# Patient Record
Sex: Male | Born: 1955 | Race: White | Hispanic: No | Marital: Married | Smoking: Current every day smoker
Health system: Southern US, Community
[De-identification: ages and names within clinical notes are randomized; demographics above are authoritative.]

## PROBLEM LIST (undated history)

## (undated) DIAGNOSIS — I209 Angina pectoris, unspecified: Secondary | ICD-10-CM

## (undated) DIAGNOSIS — K5900 Constipation, unspecified: Secondary | ICD-10-CM

## (undated) DIAGNOSIS — K8020 Calculus of gallbladder without cholecystitis without obstruction: Secondary | ICD-10-CM

## (undated) DIAGNOSIS — I639 Cerebral infarction, unspecified: Secondary | ICD-10-CM

## (undated) DIAGNOSIS — N451 Epididymitis: Secondary | ICD-10-CM

## (undated) DIAGNOSIS — I499 Cardiac arrhythmia, unspecified: Secondary | ICD-10-CM

## (undated) DIAGNOSIS — I350 Nonrheumatic aortic (valve) stenosis: Secondary | ICD-10-CM

## (undated) DIAGNOSIS — I050 Rheumatic mitral stenosis: Secondary | ICD-10-CM

## (undated) DIAGNOSIS — R0602 Shortness of breath: Secondary | ICD-10-CM

## (undated) DIAGNOSIS — I739 Peripheral vascular disease, unspecified: Secondary | ICD-10-CM

## (undated) DIAGNOSIS — I998 Other disorder of circulatory system: Secondary | ICD-10-CM

## (undated) DIAGNOSIS — G510 Bell's palsy: Secondary | ICD-10-CM

## (undated) DIAGNOSIS — G40909 Epilepsy, unspecified, not intractable, without status epilepticus: Secondary | ICD-10-CM

## (undated) DIAGNOSIS — I10 Essential (primary) hypertension: Secondary | ICD-10-CM

## (undated) DIAGNOSIS — R197 Diarrhea, unspecified: Secondary | ICD-10-CM

## (undated) DIAGNOSIS — E7800 Pure hypercholesterolemia, unspecified: Secondary | ICD-10-CM

## (undated) DIAGNOSIS — I219 Acute myocardial infarction, unspecified: Secondary | ICD-10-CM

## (undated) DIAGNOSIS — E785 Hyperlipidemia, unspecified: Secondary | ICD-10-CM

## (undated) HISTORY — DX: Hyperlipidemia, unspecified: E78.5

## (undated) HISTORY — DX: Essential (primary) hypertension: I10

## (undated) HISTORY — DX: Other disorder of circulatory system: I99.8

## (undated) HISTORY — DX: Pure hypercholesterolemia, unspecified: E78.00

## (undated) HISTORY — DX: Cerebral infarction, unspecified: I63.9

## (undated) HISTORY — DX: Epilepsy, unspecified, not intractable, without status epilepticus: G40.909

## (undated) HISTORY — DX: Peripheral vascular disease, unspecified: I73.9

## (undated) HISTORY — DX: Nonrheumatic aortic (valve) stenosis: I35.0

## (undated) HISTORY — DX: Acute myocardial infarction, unspecified: I21.9

## (undated) HISTORY — PX: CORONARY ANGIOPLASTY: SHX604

## (undated) HISTORY — DX: Bell's palsy: G51.0

## (undated) HISTORY — DX: Rheumatic mitral stenosis: I05.0

## (undated) HISTORY — DX: Epididymitis: N45.1

---

## 1999-08-14 ENCOUNTER — Ambulatory Visit (HOSPITAL_COMMUNITY): Admission: RE | Admit: 1999-08-14 | Discharge: 1999-08-14 | Payer: Self-pay | Admitting: Orthopedic Surgery

## 1999-08-14 ENCOUNTER — Encounter: Payer: Self-pay | Admitting: Orthopedic Surgery

## 2003-06-06 ENCOUNTER — Ambulatory Visit (HOSPITAL_COMMUNITY): Admission: RE | Admit: 2003-06-06 | Discharge: 2003-06-06 | Payer: Self-pay | Admitting: *Deleted

## 2003-06-26 ENCOUNTER — Ambulatory Visit (HOSPITAL_COMMUNITY): Admission: RE | Admit: 2003-06-26 | Discharge: 2003-06-26 | Payer: Self-pay | Admitting: Cardiology

## 2003-07-18 ENCOUNTER — Encounter: Payer: Self-pay | Admitting: Family Medicine

## 2003-07-18 ENCOUNTER — Ambulatory Visit (HOSPITAL_COMMUNITY): Admission: RE | Admit: 2003-07-18 | Discharge: 2003-07-18 | Payer: Self-pay | Admitting: Family Medicine

## 2004-10-16 ENCOUNTER — Ambulatory Visit (HOSPITAL_COMMUNITY): Admission: RE | Admit: 2004-10-16 | Discharge: 2004-10-16 | Payer: Self-pay | Admitting: Family Medicine

## 2005-09-29 HISTORY — PX: OTHER SURGICAL HISTORY: SHX169

## 2006-02-02 ENCOUNTER — Ambulatory Visit (HOSPITAL_COMMUNITY): Admission: RE | Admit: 2006-02-02 | Discharge: 2006-02-02 | Payer: Self-pay | Admitting: Family Medicine

## 2006-04-03 ENCOUNTER — Ambulatory Visit (HOSPITAL_COMMUNITY): Admission: RE | Admit: 2006-04-03 | Discharge: 2006-04-03 | Payer: Self-pay | Admitting: Psychiatry

## 2006-07-07 ENCOUNTER — Ambulatory Visit (HOSPITAL_COMMUNITY): Admission: RE | Admit: 2006-07-07 | Discharge: 2006-07-07 | Payer: Self-pay | Admitting: Cardiovascular Disease

## 2006-07-16 ENCOUNTER — Ambulatory Visit (HOSPITAL_COMMUNITY): Admission: RE | Admit: 2006-07-16 | Discharge: 2006-07-17 | Payer: Self-pay | Admitting: Cardiology

## 2011-10-08 ENCOUNTER — Telehealth: Payer: Self-pay

## 2011-10-09 NOTE — Telephone Encounter (Signed)
Pt said he had a colonoscopy approx 10 yrs ago at [facility]. Said he had polyps but was not told when he should have his next. Has a family hx of colon cancer in his Dad. Has OV for 10/15/2011 @ 9:30 Am with Tana Coast, PA.

## 2011-10-15 ENCOUNTER — Ambulatory Visit: Payer: Self-pay | Admitting: Gastroenterology

## 2011-12-29 DIAGNOSIS — N451 Epididymitis: Secondary | ICD-10-CM

## 2011-12-29 HISTORY — DX: Epididymitis: N45.1

## 2012-01-14 ENCOUNTER — Ambulatory Visit (INDEPENDENT_AMBULATORY_CARE_PROVIDER_SITE_OTHER): Payer: Medicaid Other | Admitting: Gastroenterology

## 2012-01-14 ENCOUNTER — Ambulatory Visit (HOSPITAL_COMMUNITY)
Admission: RE | Admit: 2012-01-14 | Discharge: 2012-01-14 | Disposition: A | Discharge status: Home / Self Care | Payer: Medicaid Other | Source: Ambulatory Visit | Attending: Gastroenterology | Admitting: Gastroenterology

## 2012-01-14 ENCOUNTER — Encounter: Payer: Self-pay | Admitting: Gastroenterology

## 2012-01-14 ENCOUNTER — Encounter (HOSPITAL_COMMUNITY): Payer: Self-pay

## 2012-01-14 VITALS — BP 151/84 | HR 103 | Temp 97.6°F | Ht 71.0 in | Wt 236.6 lb

## 2012-01-14 DIAGNOSIS — R109 Unspecified abdominal pain: Secondary | ICD-10-CM

## 2012-01-14 DIAGNOSIS — R1031 Right lower quadrant pain: Secondary | ICD-10-CM | POA: Insufficient documentation

## 2012-01-14 DIAGNOSIS — N508 Other specified disorders of male genital organs: Secondary | ICD-10-CM | POA: Insufficient documentation

## 2012-01-14 DIAGNOSIS — Z800 Family history of malignant neoplasm of digestive organs: Secondary | ICD-10-CM

## 2012-01-14 DIAGNOSIS — K8020 Calculus of gallbladder without cholecystitis without obstruction: Secondary | ICD-10-CM | POA: Insufficient documentation

## 2012-01-14 LAB — CBC WITH DIFFERENTIAL/PLATELET
Basophils Absolute: 0.1 10*3/uL (ref 0.0–0.1)
Basophils Relative: 1 % (ref 0–1)
Eosinophils Absolute: 0.1 10*3/uL (ref 0.0–0.7)
Eosinophils Relative: 1 % (ref 0–5)
HCT: 50.6 % (ref 39.0–52.0)
Hemoglobin: 17.7 g/dL — ABNORMAL HIGH (ref 13.0–17.0)
MCH: 32.3 pg (ref 26.0–34.0)
MCHC: 35 g/dL (ref 30.0–36.0)
MCV: 92.3 fL (ref 78.0–100.0)
Monocytes Absolute: 0.9 10*3/uL (ref 0.1–1.0)
Monocytes Relative: 9 % (ref 3–12)
Neutro Abs: 6.2 10*3/uL (ref 1.7–7.7)
RDW: 12.9 % (ref 11.5–15.5)

## 2012-01-14 LAB — COMPREHENSIVE METABOLIC PANEL
AST: 15 U/L (ref 0–37)
Albumin: 4.1 g/dL (ref 3.5–5.2)
Alkaline Phosphatase: 87 U/L (ref 39–117)
BUN: 10 mg/dL (ref 6–23)
Creat: 0.86 mg/dL (ref 0.50–1.35)
Glucose, Bld: 113 mg/dL — ABNORMAL HIGH (ref 70–99)
Total Bilirubin: 0.3 mg/dL (ref 0.3–1.2)

## 2012-01-14 NOTE — Patient Instructions (Signed)
Please have blood work done. We are also proceeding with a CT scan as soon as possible.  We will set you up for a colonoscopy once the results are back from this.

## 2012-01-14 NOTE — Progress Notes (Signed)
Referring Provider: Kirk Ruths, MD Primary Care Physician:  Kirk Ruths, MD, MD Primary Gastroenterologist:  Dr. Jena Gauss   Chief Complaint  Patient presents with  . Colonoscopy    HPI:   56 year old male presenting today as referral by Dr. Regino Schultze, needs screening colonoscopy. +FH of colon cancer in father. Pt reports last TCS 8 or 9 years ago at [facility], notes polyps. No op notes available at time of visit. Hx of constipation in past but recently had worsening of constipation, up to 2 weeks. Only eats one meal a day. Given Align by PCP and notes improvement of bowel habits. Continues gas. Notes intermittent rectal bleeding.  Interesting symptoms of right flank pain X 3 weeks, right groin pain, RLE numbness. Not able to sit down at time of appt. Standing up, obviously uncomfortable. During physical exam pain with laying down. Denies abdominal pain. Being treated for epididymitis, referred to urology next week. Denies any urinary symptoms, fever, chills.    Past Medical History  Diagnosis Date  . Hypertension   . Hypercholesterolemia   . PAD (peripheral artery disease)   . Mitral stenosis   . Bell's palsy   . Epilepsy   . CVA (cerebral infarction)     X2   . Myocardial infarct   . Epididymitis April 2013    Past Surgical History  Procedure Date  . Coronary angioplasty     1976  . Pta and stenting of left iliac artery 2007    [place]  . Pta and angioplasty of left superifical femoral artery 2007    Current Outpatient Prescriptions  Medication Sig Dispense Refill  . amLODipine (NORVASC) 5 MG tablet Take 5 mg by mouth daily.      . clopidogrel (PLAVIX) 75 MG tablet Take 75 mg by mouth daily.      Marland Kitchen doxycycline (VIBRAMYCIN) 100 MG capsule Take 100 mg by mouth 2 (two) times daily.      . penicillin v potassium (VEETID) 250 MG tablet Take 250 mg by mouth 4 (four) times daily.      Marland Kitchen PHENobarbital (LUMINAL) 64.8 MG tablet Take 64.8 mg by mouth 2 (two) times daily.         Allergies as of 01/14/2012 - Review Complete 01/14/2012  Allergen Reaction Noted  . Contrast media (iodinated diagnostic agents)  01/14/2012    Family History  Problem Relation Age of Onset  . Colon cancer Father     deceased from gunshot, in late 49s    History   Social History  . Marital Status: Single    Spouse Name: N/A    Number of Children: N/A  . Years of Education: N/A   Occupational History  . Not on file.   Social History Main Topics  . Smoking status: Current Everyday Smoker -- 5.0 packs/day    Types: Cigars  . Smokeless tobacco: Not on file   Comment: 5 a day  . Alcohol Use: No  . Drug Use: No  . Sexually Active: Not on file   Other Topics Concern  . Not on file   Social History Narrative  . No narrative on file    Review of Systems: Gen: Denies any fever, chills, loss of appetite, fatigue, weight loss. CV: Denies chest pain, heart palpitations, syncope, peripheral edema. Resp: Denies shortness of breath with rest, cough, wheezing GI: Denies dysphagia or odynophagia. Denies hematemesis, fecal incontinence, or jaundice.  GU : Denies urinary burning, urinary frequency, urinary incontinence.  MS: Denies joint pain,  muscle weakness, cramps, limited movement Derm: Denies rash, itching, dry skin Psych: Denies depression, anxiety, confusion or memory loss  Heme: Denies bruising, bleeding, and enlarged lymph nodes.  Physical Exam: BP 151/84  Pulse 103  Temp(Src) 97.6 F (36.4 C) (Temporal)  Ht 5\' 11"  (1.803 m)  Wt 236 lb 9.6 oz (107.321 kg)  BMI 33.00 kg/m2 General:   Alert and oriented. Well-developed, well-nourished, pleasant and cooperative. Appears in obvious discomfort, standing up.  Head:  Normocephalic and atraumatic. Eyes:  Conjunctiva pink, sclera clear, no icterus.    Ears:  Normal auditory acuity. Nose:  No deformity, discharge,  or lesions. Mouth:  No deformity or lesions, mucosa pink and moist.  Neck:  Supple, without mass or  thyromegaly. Lungs:  Clear to auscultation bilaterally, without wheezing, rales, or rhonchi.  Heart:  S1, S2 present without murmurs noted.  Abdomen:  +BS, soft, non-tender and non-distended. Without mass or HSM. No rebound or guarding. No hernias noted. NO CVA tenderness Rectal:  Deferred  Msk:  Symmetrical without gross deformities. Normal posture. Extremities:  Trace edema Neurologic:  Alert and  oriented x4;  grossly normal neurologically. Skin:  Intact, warm and dry without significant lesions or rashes Cervical Nodes:  No significant cervical adenopathy. Psych:  Alert and cooperative. Normal mood and affect.

## 2012-01-15 ENCOUNTER — Encounter: Payer: Self-pay | Admitting: Gastroenterology

## 2012-01-15 DIAGNOSIS — R109 Unspecified abdominal pain: Secondary | ICD-10-CM | POA: Insufficient documentation

## 2012-01-15 DIAGNOSIS — Z800 Family history of malignant neoplasm of digestive organs: Secondary | ICD-10-CM | POA: Insufficient documentation

## 2012-01-15 NOTE — Assessment & Plan Note (Addendum)
56 year old presenting for colonoscopy, +FH of colon cancer. At time of visit, in obvious discomfort, unable to sit down. Reporting right flank pain, no CVA tenderness on exam, no urinary symptoms. Describes right groin pain as well, extends into testicle. Being treated for epididymitis by Dr. Regino Schultze, appt with urology upcoming. Due to pt presentation and obvious discomfort, would like to obtain CT scan ASAP. Pt also notes worsening of constipation recently, yet this has improved with Align daily. Intermittent rectal bleeding. At this point, unclear etiology of flank pain, testicular and groin pain.   Proceed with CT today: no IV contrast, only oral due to allergy CBC, CMP stat Follow-up with urology To ED if testicular swelling, severe pain

## 2012-01-15 NOTE — Assessment & Plan Note (Signed)
Last TCS by [facility], 8 or 9 years ago per pt. No op notes at time of visit. Intermittent rectal bleeding, recent change in bowel habits to severe constipation. Improved with Align. +FH of colon cancer, father. Change in bowel habits concerning, question if above issues or right flank pain even associated with GI issues. Obtaining CT prior to colonoscopy to assess for any acute issues prior to procedure.   Proceed with TCS with Dr. Jena Gauss in near future: the risks, benefits, and alternatives have been discussed with the patient in detail. The patient states understanding and desires to proceed.

## 2012-01-15 NOTE — Progress Notes (Signed)
Quick Note:  Reviewed CT with pt. Cholelithiasis but no other issues. Called pt, pain gets worse throughout day. States has urology appt next Tues. I asked him to call us when this is completed.  Need to set up outpatient colonoscopy in near future. ______

## 2012-01-15 NOTE — Progress Notes (Unsigned)
Saw in clinic this week with acute flank pain. CT negative.  Needs TCS with [facility] set up in near future.

## 2012-01-19 NOTE — Progress Notes (Signed)
Faxed to PCP

## 2012-01-20 ENCOUNTER — Other Ambulatory Visit: Payer: Self-pay | Admitting: Gastroenterology

## 2012-01-20 DIAGNOSIS — Z800 Family history of malignant neoplasm of digestive organs: Secondary | ICD-10-CM

## 2012-01-20 MED ORDER — PEG-KCL-NACL-NASULF-NA ASC-C 100 G PO SOLR: 1.0000 | Freq: Once | ORAL | Status: DC

## 2012-01-20 NOTE — Progress Notes (Signed)
TCS scheduled for 05/06- instructions and Rx mailed to home address

## 2012-01-29 ENCOUNTER — Encounter (HOSPITAL_COMMUNITY): Payer: Self-pay | Admitting: Pharmacy Technician

## 2012-01-30 MED ORDER — SODIUM CHLORIDE 0.45 % IV SOLN
Freq: Once | INTRAVENOUS | Status: AC
  Administered 2012-02-02: 10:00:00 via INTRAVENOUS

## 2012-02-02 ENCOUNTER — Ambulatory Visit (HOSPITAL_COMMUNITY)
Admission: RE | Admit: 2012-02-02 | Discharge: 2012-02-02 | Disposition: A | Discharge status: Home Health | Payer: Medicaid Other | Source: Ambulatory Visit | Attending: Internal Medicine | Admitting: Internal Medicine

## 2012-02-02 ENCOUNTER — Encounter (HOSPITAL_COMMUNITY)
Admission: RE | Disposition: A | Discharge status: Home Health | Payer: Self-pay | Source: Ambulatory Visit | Attending: Internal Medicine

## 2012-02-02 ENCOUNTER — Encounter (HOSPITAL_COMMUNITY): Payer: Self-pay | Admitting: *Deleted

## 2012-02-02 DIAGNOSIS — Z860100 Personal history of colon polyps, unspecified: Secondary | ICD-10-CM | POA: Insufficient documentation

## 2012-02-02 DIAGNOSIS — E7800 Pure hypercholesterolemia, unspecified: Secondary | ICD-10-CM | POA: Insufficient documentation

## 2012-02-02 DIAGNOSIS — K648 Other hemorrhoids: Secondary | ICD-10-CM | POA: Insufficient documentation

## 2012-02-02 DIAGNOSIS — D126 Benign neoplasm of colon, unspecified: Secondary | ICD-10-CM | POA: Insufficient documentation

## 2012-02-02 DIAGNOSIS — Z800 Family history of malignant neoplasm of digestive organs: Secondary | ICD-10-CM

## 2012-02-02 DIAGNOSIS — D128 Benign neoplasm of rectum: Secondary | ICD-10-CM | POA: Insufficient documentation

## 2012-02-02 DIAGNOSIS — Z79899 Other long term (current) drug therapy: Secondary | ICD-10-CM | POA: Insufficient documentation

## 2012-02-02 DIAGNOSIS — I10 Essential (primary) hypertension: Secondary | ICD-10-CM | POA: Insufficient documentation

## 2012-02-02 DIAGNOSIS — K620 Anal polyp: Secondary | ICD-10-CM

## 2012-02-02 DIAGNOSIS — K621 Rectal polyp: Secondary | ICD-10-CM

## 2012-02-02 DIAGNOSIS — K5730 Diverticulosis of large intestine without perforation or abscess without bleeding: Secondary | ICD-10-CM | POA: Insufficient documentation

## 2012-02-02 DIAGNOSIS — K921 Melena: Secondary | ICD-10-CM

## 2012-02-02 HISTORY — PX: COLONOSCOPY: SHX5424

## 2012-02-02 SURGERY — COLONOSCOPY
Anesthesia: Moderate Sedation

## 2012-02-02 MED ORDER — MIDAZOLAM HCL 5 MG/5ML IJ SOLN
INTRAMUSCULAR | Status: AC
  Filled 2012-02-02: qty 10

## 2012-02-02 MED ORDER — MIDAZOLAM HCL 5 MG/5ML IJ SOLN
INTRAMUSCULAR | Status: DC | PRN
  Administered 2012-02-02: 2 mg via INTRAVENOUS
  Administered 2012-02-02: 1 mg via INTRAVENOUS
  Administered 2012-02-02: 2 mg via INTRAVENOUS

## 2012-02-02 MED ORDER — LIDOCAINE HCL 2 % EX GEL
CUTANEOUS | Status: DC | PRN
  Administered 2012-02-02: 1 via TOPICAL

## 2012-02-02 MED ORDER — HYDROCORTISONE ACETATE 25 MG RE SUPP: 25.0000 mg | Freq: Two times a day (BID) | RECTAL | Status: AC

## 2012-02-02 MED ORDER — STERILE WATER FOR IRRIGATION IR SOLN
Status: DC | PRN
  Administered 2012-02-02: 11:00:00

## 2012-02-02 MED ORDER — MEPERIDINE HCL 100 MG/ML IJ SOLN
INTRAMUSCULAR | Status: DC | PRN
  Administered 2012-02-02 (×2): 50 mg via INTRAVENOUS

## 2012-02-02 MED ORDER — MEPERIDINE HCL 100 MG/ML IJ SOLN
INTRAMUSCULAR | Status: AC
  Filled 2012-02-02: qty 1

## 2012-02-02 NOTE — Interval H&P Note (Signed)
History and Physical Interval Note:  02/02/2012 10:31 AM  Joshua Cruz  has presented today for surgery, with the diagnosis of FH of CRC, constipation  The various methods of treatment have been discussed with the patient and family. After consideration of risks, benefits and other options for treatment, the patient has consented to  Procedure(s) (LRB): COLONOSCOPY (N/A) as a surgical intervention .  The patients' history has been reviewed, patient examined, no change in status, stable for surgery.  I have reviewed the patients' chart and labs.  Questions were answered to the patient's satisfaction.     Eula Listen

## 2012-02-02 NOTE — Interval H&P Note (Signed)
History and Physical Interval Note:  02/02/2012 10:31 AM  Joshua Cruz  has presented today for surgery, with the diagnosis of FH of CRC, constipation  The various methods of treatment have been discussed with the patient and family. After consideration of risks, benefits and other options for treatment, the patient has consented to  Procedure(s) (LRB): COLONOSCOPY (N/A) as a surgical intervention .  The patients' history has been reviewed, patient examined, no change in status, stable for surgery.  I have reviewed the patients' chart and labs.  Questions were answered to the patient's satisfaction.     Suellyn Meenan   

## 2012-02-02 NOTE — H&P (View-Only) (Signed)
Referring Provider: McGough, William M, MD Primary Care Physician:  MCGOUGH,WILLIAM M, MD, MD Primary Gastroenterologist:  Dr. Rourk   Chief Complaint  Patient presents with  . Colonoscopy    HPI:   56-year-old male presenting today as referral by Dr. McGough, needs screening colonoscopy. +FH of colon cancer in father. Pt reports last TCS 8 or 9 years ago at [facility], notes polyps. No op notes available at time of visit. Hx of constipation in past but recently had worsening of constipation, up to 2 weeks. Only eats one meal a day. Given Align by PCP and notes improvement of bowel habits. Continues gas. Notes intermittent rectal bleeding.  Interesting symptoms of right flank pain X 3 weeks, right groin pain, RLE numbness. Not able to sit down at time of appt. Standing up, obviously uncomfortable. During physical exam pain with laying down. Denies abdominal pain. Being treated for epididymitis, referred to urology next week. Denies any urinary symptoms, fever, chills.    Past Medical History  Diagnosis Date  . Hypertension   . Hypercholesterolemia   . PAD (peripheral artery disease)   . Mitral stenosis   . Bell's palsy   . Epilepsy   . CVA (cerebral infarction)     X2   . Myocardial infarct   . Epididymitis April 2013    Past Surgical History  Procedure Date  . Coronary angioplasty     1976  . Pta and stenting of left iliac artery 2007    [place]  . Pta and angioplasty of left superifical femoral artery 2007    Current Outpatient Prescriptions  Medication Sig Dispense Refill  . amLODipine (NORVASC) 5 MG tablet Take 5 mg by mouth daily.      . clopidogrel (PLAVIX) 75 MG tablet Take 75 mg by mouth daily.      . doxycycline (VIBRAMYCIN) 100 MG capsule Take 100 mg by mouth 2 (two) times daily.      . penicillin v potassium (VEETID) 250 MG tablet Take 250 mg by mouth 4 (four) times daily.      . PHENobarbital (LUMINAL) 64.8 MG tablet Take 64.8 mg by mouth 2 (two) times daily.         Allergies as of 01/14/2012 - Review Complete 01/14/2012  Allergen Reaction Noted  . Contrast media (iodinated diagnostic agents)  01/14/2012    Family History  Problem Relation Age of Onset  . Colon cancer Father     deceased from gunshot, in late 30s    History   Social History  . Marital Status: Single    Spouse Name: N/A    Number of Children: N/A  . Years of Education: N/A   Occupational History  . Not on file.   Social History Main Topics  . Smoking status: Current Everyday Smoker -- 5.0 packs/day    Types: Cigars  . Smokeless tobacco: Not on file   Comment: 5 a day  . Alcohol Use: No  . Drug Use: No  . Sexually Active: Not on file   Other Topics Concern  . Not on file   Social History Narrative  . No narrative on file    Review of Systems: Gen: Denies any fever, chills, loss of appetite, fatigue, weight loss. CV: Denies chest pain, heart palpitations, syncope, peripheral edema. Resp: Denies shortness of breath with rest, cough, wheezing GI: Denies dysphagia or odynophagia. Denies hematemesis, fecal incontinence, or jaundice.  GU : Denies urinary burning, urinary frequency, urinary incontinence.  MS: Denies joint pain,   muscle weakness, cramps, limited movement Derm: Denies rash, itching, dry skin Psych: Denies depression, anxiety, confusion or memory loss  Heme: Denies bruising, bleeding, and enlarged lymph nodes.  Physical Exam: BP 151/84  Pulse 103  Temp(Src) 97.6 F (36.4 C) (Temporal)  Ht 5' 11" (1.803 m)  Wt 236 lb 9.6 oz (107.321 kg)  BMI 33.00 kg/m2 General:   Alert and oriented. Well-developed, well-nourished, pleasant and cooperative. Appears in obvious discomfort, standing up.  Head:  Normocephalic and atraumatic. Eyes:  Conjunctiva pink, sclera clear, no icterus.    Ears:  Normal auditory acuity. Nose:  No deformity, discharge,  or lesions. Mouth:  No deformity or lesions, mucosa pink and moist.  Neck:  Supple, without mass or  thyromegaly. Lungs:  Clear to auscultation bilaterally, without wheezing, rales, or rhonchi.  Heart:  S1, S2 present without murmurs noted.  Abdomen:  +BS, soft, non-tender and non-distended. Without mass or HSM. No rebound or guarding. No hernias noted. NO CVA tenderness Rectal:  Deferred  Msk:  Symmetrical without gross deformities. Normal posture. Extremities:  Trace edema Neurologic:  Alert and  oriented x4;  grossly normal neurologically. Skin:  Intact, warm and dry without significant lesions or rashes Cervical Nodes:  No significant cervical adenopathy. Psych:  Alert and cooperative. Normal mood and affect.   

## 2012-02-02 NOTE — Op Note (Signed)
[facility] [address] [place], [place]  [address]  COLONOSCOPY PROCEDURE REPORT  PATIENT:  Joshua, Cruz  MR#:  [id number] BIRTHDATE:  11/07/1955, 55 yrs. old  GENDER:  male ENDOSCOPIST:  R. Roetta Sessions, MD FACP [facility] REF. BY:  Karleen Hampshire, M.D. PROCEDURE DATE:  02/02/2012 PROCEDURE:  Colonoscopy with snare polypectomy and polyp ablation  INDICATIONS:  Personal history of colon polyps. Positive family history of colon cancer. Hematochezia.  INFORMED CONSENT:  The risks, benefits, alternatives and imponderables including but not limited to bleeding, perforation as well as the possibility of a missed lesion have been reviewed. The potential for biopsy, lesion removal, etc. have also been discussed.  Questions have been answered.  All parties agreeable. Please see the history and physical in the medical record for more information.  MEDICATIONS:  Versed 5 mg IV and Demerol 100 mg IV in divided doses.  DESCRIPTION OF PROCEDURE:  After a digital rectal exam was performed, the EC-[id number] ([id number]) colonoscope was advanced from the anus through the rectum and colon to the area of the cecum, ileocecal valve and appendiceal orifice.  The cecum was deeply intubated.  These structures were well-seen and photographed for the record.  From the level of the cecum and ileocecal valve, the scope was slowly and cautiously withdrawn.  The mucosal surfaces were carefully surveyed utilizing scope tip deflection to facilitate fold flattening as needed.  The scope was pulled down into the rectum where a thorough examination including retroflexion was performed. <<PROCEDUREIMAGES>>  FINDINGS: Adequate Preparation. Internal hemorrhoids. Multiple diminutive rectal polyps; one 5 mm pedunculated polyp just inside the anal verge. Left-sided diverticulosis;  9 mm pedunculated polyp in the transverse segment; remainder of colonic mucosa appeared normal.  THERAPEUTIC / DIAGNOSTIC  MANEUVERS PERFORMED: The ascending colon polyp was hot snare removed; the distal rectal polyp was also hot snare removed. The diminutive rectal polyps were ablated with the tip of the hot snare loop.  COMPLICATIONS:   None  CECAL WITHDRAWAL TIME: 9 minutes  IMPRESSION: Internal hemorrhoids-likely source of hematochezia. Multiple rectal polyps treated as described above. Colonic polyp resected as                            described above. Colonic diverticulosis.  RECOMMENDATIONS:   Course of Anusol suppositories. Followup on pathology. Further recommendations to follow.  ______________________________ R. Roetta Sessions, MD Caleen Essex  CC:  Karleen Hampshire, M.D.  n. eSIGNED:   R. Roetta Sessions at 02/02/2012 11:09 AM  Nada Maclachlan, [phone]

## 2012-02-02 NOTE — H&P (View-Only) (Signed)
Quick Note:  Reviewed CT with pt. Cholelithiasis but no other issues. Called pt, pain gets worse throughout day. States has urology appt next Tues. I asked him to call us when this is completed.  Need to set up outpatient colonoscopy in near future. ______ 

## 2012-02-02 NOTE — Discharge Instructions (Addendum)
Colonoscopy Discharge Instructions  Read the instructions outlined below and refer to this sheet in the next few weeks. These discharge instructions provide you with general information on caring for yourself after you leave the hospital. Your doctor may also give you specific instructions. While your treatment has been planned according to the most current medical practices available, unavoidable complications occasionally occur. If you have any problems or questions after discharge, call Dr. Jena Gauss at [phone]. ACTIVITY  You may resume your regular activity, but move at a slower pace for the next 24 hours.   Take frequent rest periods for the next 24 hours.   Walking will help get rid of the air and reduce the bloated feeling in your belly (abdomen).   No driving for 24 hours (because of the medicine (anesthesia) used during the test).    Do not sign any important legal documents or operate any machinery for 24 hours (because of the anesthesia used during the test).  NUTRITION  Drink plenty of fluids.   You may resume your normal diet as instructed by your doctor.   Begin with a light meal and progress to your normal diet. Heavy or fried foods are harder to digest and may make you feel sick to your stomach (nauseated).   Avoid alcoholic beverages for 24 hours or as instructed.  MEDICATIONS  You may resume your normal medications unless your doctor tells you otherwise.  WHAT YOU CAN EXPECT TODAY  Some feelings of bloating in the abdomen.   Passage of more gas than usual.   Spotting of blood in your stool or on the toilet paper.  IF YOU HAD POLYPS REMOVED DURING THE COLONOSCOPY:  No aspirin products for 7 days or as instructed.   No alcohol for 7 days or as instructed.   Eat a soft diet for the next 24 hours.  FINDING OUT THE RESULTS OF YOUR TEST Not all test results are available during your visit. If your test results are not back during the visit, make an appointment  with your caregiver to find out the results. Do not assume everything is normal if you have not heard from your caregiver or the medical facility. It is important for you to follow up on all of your test results.  SEEK IMMEDIATE MEDICAL ATTENTION IF:  You have more than a spotting of blood in your stool.   Your belly is swollen (abdominal distention).   You are nauseated or vomiting.   You have a temperature over 101.   You have abdominal pain or discomfort that is severe or gets worse throughout the day.   \  Diverticulosis, hemorrhoid and polyp information provided.  Course of Anusol suppositories as directed.  Further recommendations to follow pending review of pathology report  Diverticulosis Diverticulosis is a common condition that develops when small pouches (diverticula) form in the wall of the colon. The risk of diverticulosis increases with age. It happens more often in people who eat a low-fiber diet. Most individuals with diverticulosis have no symptoms. Those individuals with symptoms usually experience abdominal pain, constipation, or loose stools (diarrhea). HOME CARE INSTRUCTIONS   Increase the amount of fiber in your diet as directed by your caregiver or dietician. This may reduce symptoms of diverticulosis.   Your caregiver may recommend taking a dietary fiber supplement.   Drink at least 6 to 8 glasses of water each day to prevent constipation.   Try not to strain when you have a bowel movement.   Your  caregiver may recommend avoiding nuts and seeds to prevent complications, although this is still an uncertain benefit.   Only take over-the-counter or prescription medicines for pain, discomfort, or fever as directed by your caregiver.  FOODS WITH HIGH FIBER CONTENT INCLUDE:  Fruits. Apple, peach, pear, tangerine, raisins, prunes.   Vegetables. Brussels sprouts, asparagus, broccoli, cabbage, carrot, cauliflower, romaine lettuce, spinach, summer squash, tomato,  winter squash, zucchini.   Starchy Vegetables. Baked beans, kidney beans, lima beans, split peas, lentils, potatoes (with skin).   Grains. Whole wheat bread, brown rice, bran flake cereal, plain oatmeal, white rice, shredded wheat, bran muffins.  SEEK IMMEDIATE MEDICAL CARE IF:   You develop increasing pain or severe bloating.   You have an oral temperature above 102 F (38.9 C), not controlled by medicine.   You develop vomiting or bowel movements that are bloody or black.  Document Released: 06/12/2004 Document Revised: 09/04/2011 Document Reviewed: 02/13/2010 [facility] Patient Information 2012 [place], [place].  Hemorrhoids Hemorrhoids are enlarged (dilated) veins around the rectum. There are 2 types of hemorrhoids, and the type of hemorrhoid is determined by its location. Internal hemorrhoids occur in the veins just inside the rectum.They are usually not painful, but they may bleed.However, they may poke through to the outside and become irritated and painful. External hemorrhoids involve the veins outside the anus and can be felt as a painful swelling or hard lump near the anus.They are often itchy and may crack and bleed. Sometimes clots will form in the veins. This makes them swollen and painful. These are called thrombosed hemorrhoids. CAUSES Causes of hemorrhoids include:  Pregnancy. This increases the pressure in the hemorrhoidal veins.   Constipation.   Straining to have a bowel movement.   Obesity.   Heavy lifting or other activity that caused you to strain.  TREATMENT Most of the time hemorrhoids improve in 1 to 2 weeks. However, if symptoms do not seem to be getting better or if you have a lot of rectal bleeding, your caregiver may perform a procedure to help make the hemorrhoids get smaller or remove them completely.Possible treatments include:  Rubber band ligation. A rubber band is placed at the base of the hemorrhoid to cut off the circulation.   Sclerotherapy.  A chemical is injected to shrink the hemorrhoid.   Infrared light therapy. Tools are used to burn the hemorrhoid.   Hemorrhoidectomy. This is surgical removal of the hemorrhoid.  HOME CARE INSTRUCTIONS   Increase fiber in your diet. Ask your caregiver about using fiber supplements.   Drink enough water and fluids to keep your urine clear or pale yellow.   Exercise regularly.   Go to the bathroom when you have the urge to have a bowel movement. Do not wait.   Avoid straining to have bowel movements.   Keep the anal area dry and clean.   Only take over-the-counter or prescription medicines for pain, discomfort, or fever as directed by your caregiver.  If your hemorrhoids are thrombosed:  Take warm sitz baths for 20 to 30 minutes, 3 to 4 times per day.   If the hemorrhoids are very tender and swollen, place ice packs on the area as tolerated. Using ice packs between sitz baths may be helpful. Fill a plastic bag with ice. Place a towel between the bag of ice and your skin.   Medicated creams and suppositories may be used or applied as directed.   Do not use a donut-shaped pillow or sit on the toilet for long  periods. This increases blood pooling and pain.  SEEK MEDICAL CARE IF:   You have increasing pain and swelling that is not controlled with your medicine.   You have uncontrolled bleeding.   You have difficulty or you are unable to have a bowel movement.   You have pain or inflammation outside the area of the hemorrhoids.   You have chills or an oral temperature above 102 F (38.9 C).  MAKE SURE YOU:   Understand these instructions.   Will watch your condition.   Will get help right away if you are not doing well or get worse.  Document Released: 09/12/2000 Document Revised: 09/04/2011 Document Reviewed: 01/18/2008 [facility] Patient Information 2012 [place], [place].  Colon Polyps A polyp is extra tissue that grows inside your body. Colon polyps grow in the large  intestine. The large intestine, also called the colon, is part of your digestive system. It is a long, hollow tube at the end of your digestive tract where your body makes and stores stool. Most polyps are not dangerous. They are benign. This means they are not cancerous. But over time, some types of polyps can turn into cancer. Polyps that are smaller than a pea are usually not harmful. But larger polyps could someday become or may already be cancerous. To be safe, doctors remove all polyps and test them.  WHO GETS POLYPS? Anyone can get polyps, but certain people are more likely than others. You may have a greater chance of getting polyps if:  You are over 50.   You have had polyps before.   Someone in your family has had polyps.   Someone in your family has had cancer of the large intestine.   Find out if someone in your family has had polyps. You may also be more likely to get polyps if you:   Eat a lot of fatty foods.   Smoke.   Drink alcohol.   Do not exercise.   Eat too much.  SYMPTOMS  Most small polyps do not cause symptoms. People often do not know they have one until their caregiver finds it during a regular checkup or while testing them for something else. Some people do have symptoms like these:  Bleeding from the anus. You might notice blood on your underwear or on toilet paper after you have had a bowel movement.   Constipation or diarrhea that lasts more than a week.   Blood in the stool. Blood can make stool look black or it can show up as red streaks in the stool.  If you have any of these symptoms, see your caregiver. HOW DOES THE DOCTOR TEST FOR POLYPS? The doctor can use four tests to check for polyps:  Digital rectal exam. The caregiver wears gloves and checks your rectum (the last part of the large intestine) to see if it feels normal. This test would find polyps only in the rectum. Your caregiver may need to do one of the other tests listed below to find  polyps higher up in the intestine.   Barium enema. The caregiver puts a liquid called barium into your rectum before taking x-rays of your large intestine. Barium makes your intestine look white in the pictures. Polyps are dark, so they are easy to see.   Sigmoidoscopy. With this test, the caregiver can see inside your large intestine. A thin flexible tube is placed into your rectum. The device is called a sigmoidoscope, which has a light and a tiny video camera in  it. The caregiver uses the sigmoidoscope to look at the last third of your large intestine.   Colonoscopy. This test is like sigmoidoscopy, but the caregiver looks at all of the large intestine. It usually requires sedation. This is the most common method for finding and removing polyps.  TREATMENT   The caregiver will remove the polyp during sigmoidoscopy or colonoscopy. The polyp is then tested for cancer.   If you have had polyps, your caregiver may want you to get tested regularly in the future.  PREVENTION  There is not one sure way to prevent polyps. You might be able to lower your risk of getting them if you:  Eat more fruits and vegetables and less fatty food.   Do not smoke.   Avoid alcohol.   Exercise every day.   Lose weight if you are overweight.   Eating more calcium and folate can also lower your risk of getting polyps. Some foods that are rich in calcium are milk, cheese, and broccoli. Some foods that are rich in folate are chickpeas, kidney beans, and spinach.   Aspirin might help prevent polyps. Studies are under way.  Document Released: 06/11/2004 Document Revised: 09/04/2011 Document Reviewed: 11/17/2007 [facility] Patient Information 2012 [place], [place].

## 2012-02-06 ENCOUNTER — Encounter: Payer: Self-pay | Admitting: Internal Medicine

## 2012-02-11 ENCOUNTER — Encounter (HOSPITAL_COMMUNITY): Payer: Self-pay | Admitting: Internal Medicine

## 2012-02-17 ENCOUNTER — Encounter (HOSPITAL_COMMUNITY): Payer: Self-pay | Admitting: Pharmacy Technician

## 2012-02-20 ENCOUNTER — Other Ambulatory Visit (HOSPITAL_COMMUNITY): Payer: Self-pay | Admitting: Cardiovascular Disease

## 2012-02-20 ENCOUNTER — Ambulatory Visit (HOSPITAL_COMMUNITY)
Admission: RE | Admit: 2012-02-20 | Discharge: 2012-02-20 | Disposition: A | Discharge status: Home / Self Care | Payer: Medicaid Other | Source: Ambulatory Visit | Attending: Cardiovascular Disease | Admitting: Cardiovascular Disease

## 2012-02-20 DIAGNOSIS — R059 Cough, unspecified: Secondary | ICD-10-CM | POA: Insufficient documentation

## 2012-02-20 DIAGNOSIS — Z01818 Encounter for other preprocedural examination: Secondary | ICD-10-CM | POA: Insufficient documentation

## 2012-02-20 DIAGNOSIS — Z01811 Encounter for preprocedural respiratory examination: Secondary | ICD-10-CM

## 2012-02-20 DIAGNOSIS — R0602 Shortness of breath: Secondary | ICD-10-CM | POA: Insufficient documentation

## 2012-02-20 DIAGNOSIS — R05 Cough: Secondary | ICD-10-CM | POA: Insufficient documentation

## 2012-02-20 DIAGNOSIS — I10 Essential (primary) hypertension: Secondary | ICD-10-CM | POA: Insufficient documentation

## 2012-02-24 ENCOUNTER — Other Ambulatory Visit: Payer: Self-pay | Admitting: Cardiovascular Disease

## 2012-02-26 ENCOUNTER — Encounter (HOSPITAL_COMMUNITY)
Admission: RE | Disposition: A | Discharge status: Home / Self Care | Payer: Self-pay | Source: Ambulatory Visit | Attending: Cardiovascular Disease

## 2012-02-26 ENCOUNTER — Ambulatory Visit (HOSPITAL_COMMUNITY)
Admission: RE | Admit: 2012-02-26 | Discharge: 2012-02-26 | Disposition: A | Discharge status: Home / Self Care | Payer: Medicaid Other | Source: Ambulatory Visit | Attending: Cardiovascular Disease | Admitting: Cardiovascular Disease

## 2012-02-26 DIAGNOSIS — I70219 Atherosclerosis of native arteries of extremities with intermittent claudication, unspecified extremity: Secondary | ICD-10-CM | POA: Insufficient documentation

## 2012-02-26 HISTORY — PX: LOWER EXTREMITY ANGIOGRAM: SHX5508

## 2012-02-26 SURGERY — ANGIOGRAM, LOWER EXTREMITY
Anesthesia: LOCAL

## 2012-02-26 MED ORDER — ASPIRIN 81 MG PO CHEW
324.0000 mg | CHEWABLE_TABLET | ORAL | Status: AC
  Administered 2012-02-26: 324 mg via ORAL
  Filled 2012-02-26: qty 4

## 2012-02-26 MED ORDER — FLUMAZENIL 1 MG/10ML IV SOLN
0.2000 mg | Freq: Once | INTRAVENOUS | Status: AC
  Administered 2012-02-26: 0.2 mg via INTRAVENOUS

## 2012-02-26 MED ORDER — METHYLPREDNISOLONE SODIUM SUCC 125 MG IJ SOLR
60.0000 mg | INTRAMUSCULAR | Status: AC
  Administered 2012-02-26: 60 mg via INTRAVENOUS
  Filled 2012-02-26: qty 2

## 2012-02-26 MED ORDER — SODIUM CHLORIDE 0.9 % IV SOLN: 250.0000 mL | INTRAVENOUS | Status: DC | PRN

## 2012-02-26 MED ORDER — DIPHENHYDRAMINE HCL 50 MG/ML IJ SOLN
50.0000 mg | INTRAMUSCULAR | Status: AC
  Administered 2012-02-26: 50 mg via INTRAVENOUS
  Filled 2012-02-26: qty 1

## 2012-02-26 MED ORDER — SODIUM CHLORIDE 0.9 % IJ SOLN: 3.0000 mL | INTRAMUSCULAR | Status: DC | PRN

## 2012-02-26 MED ORDER — SODIUM CHLORIDE 0.9 % IV SOLN
INTRAVENOUS | Status: DC
  Administered 2012-02-26: 13:00:00 via INTRAVENOUS

## 2012-02-26 MED ORDER — LIDOCAINE HCL (PF) 1 % IJ SOLN
INTRAMUSCULAR | Status: AC
  Filled 2012-02-26: qty 30

## 2012-02-26 MED ORDER — FAMOTIDINE IN NACL 20-0.9 MG/50ML-% IV SOLN
20.0000 mg | INTRAVENOUS | Status: AC
  Administered 2012-02-26: 20 mg via INTRAVENOUS
  Filled 2012-02-26: qty 50

## 2012-02-26 MED ORDER — HEPARIN (PORCINE) IN NACL 2-0.9 UNIT/ML-% IJ SOLN
INTRAMUSCULAR | Status: AC
  Filled 2012-02-26: qty 1000

## 2012-02-26 MED ORDER — SODIUM CHLORIDE 0.9 % IJ SOLN: 3.0000 mL | Freq: Two times a day (BID) | INTRAMUSCULAR | Status: DC

## 2012-02-26 NOTE — H&P (Signed)
  H & P will be scanned in.  Pt was reexamined and existing H & P reviewed. No changes found.  Runell Gess, MD [facility] 02/26/2012 1:39 PM

## 2012-02-26 NOTE — Discharge Instructions (Signed)
Peripheral Vascular Disease  Peripheral Vascular Disease (PVD), also called Peripheral Arterial Disease (PAD), is a circulation problem caused by cholesterol (atherosclerotic plaque) deposits in the arteries. PVD commonly occurs in the lower extremities (legs) but it can occur in other areas of the body, such as your arms. The cholesterol buildup in the arteries reduces blood flow which can cause pain and other serious problems. The presence of PVD can place a person at risk for Coronary Artery Disease (CAD).   CAUSES   Causes of PVD can be many. It is usually associated with more than one risk factor such as:    High Cholesterol.   Smoking.   Diabetes.   Lack of exercise or inactivity.   High blood pressure (hypertension).   Obesity.   Family history.  SYMPTOMS    When the lower extremities are affected, patients with PVD may experience:   Leg pain with exertion or physical activity. This is called INTERMITTENT CLAUDICATION. This may present as cramping or numbness with physical activity. The location of the pain is associated with the level of blockage. For example, blockage at the abdominal level (distal abdominal aorta) may result in buttock or hip pain. Lower leg arterial blockage may result in calf pain.   As PVD becomes more severe, pain can develop with less physical activity.   In people with severe PVD, leg pain may occur at rest.   Other PVD signs and symptoms:   Leg numbness or weakness.   Coldness in the affected leg or foot, especially when compared to the other leg.   A change in leg color.   Patients with significant PVD are more prone to ulcers or sores on toes, feet or legs. These may take longer to heal or may reoccur. The ulcers or sores can become infected.   If signs and symptoms of PVD are ignored, gangrene may occur. This can result in the loss of toes or loss of an entire limb.   Not all leg pain is related to PVD. Other medical conditions can cause leg pain such  as:   Blood clots (embolism) or Deep Vein Thrombosis.   Inflammation of the blood vessels (vasculitis).   Spinal stenosis.  DIAGNOSIS   Diagnosis of PVD can involve several different types of tests. These can include:   Pulse Volume Recording Method (PVR). This test is simple, painless and does not involve the use of X-rays. PVR involves measuring and comparing the blood pressure in the arms and legs. An ABI (Ankle-Brachial Index) is calculated. The normal ratio of blood pressures is 1. As this number becomes smaller, it indicates more severe disease.   < 0.95 - indicates significant narrowing in one or more leg vessels.   <0.8 - there will usually be pain in the foot, leg or buttock with exercise.   <0.4 - will usually have pain in the legs at rest.   <0.25 - usually indicates limb threatening PVD.   Doppler detection of pulses in the legs. This test is painless and checks to see if you have a pulses in your legs/feet.   A dye or contrast material (a substance that highlights the blood vessels so they show up on x-ray) may be given to help your caregiver better see the arteries for the following tests. The dye is eliminated from your body by the kidney's. Your caregiver may order blood work to check your kidney function and other laboratory values before the following tests are performed:   Magnetic Resonance   study of the blood vessels and arteries. The MRA machine uses a large magnet to produce images of the blood vessels.   Computed Tomography Angiography (CTA). A CTA is a specialized x-ray that looks at how the blood flows in your blood vessels. An IV may be inserted into your arm so contrast dye can be injected.   Angiogram. Is a procedure that uses x-rays to look at your blood vessels. This procedure is minimally invasive, meaning a small incision (cut) is made in your groin. A small tube (catheter) is then inserted into the artery of  your groin. The catheter is guided to the blood vessel or artery your caregiver wants to examine. Contrast dye is injected into the catheter. X-rays are then taken of the blood vessel or artery. After the images are obtained, the catheter is taken out.  TREATMENT  Treatment of PVD involves many interventions which may include:  Lifestyle changes:   Quitting smoking.   Exercise.   Groin Site Care Refer to this sheet in the next few weeks. These instructions provide you with information on caring for yourself after your procedure. Your caregiver may also give you more specific instructions. Your treatment has been planned according to current medical practices, but problems sometimes occur. Call your caregiver if you have any problems or questions after your procedure. HOME CARE INSTRUCTIONS  You may shower 24 hours after the procedure. Remove the bandage (dressing) and gently wash the site with plain soap and water. Gently pat the site dry.   Do not apply powder or lotion to the site.   Do not sit in a bathtub, swimming pool, or whirlpool for 5 to 7 days.   No bending, squatting, or lifting anything over 10 pounds (4.5 kg) as directed by your caregiver.   Inspect the site at least twice daily.   Do not drive home if you are discharged the same day of the procedure. Have someone else drive you.   You may drive 24 hours after the procedure unless otherwise instructed by your caregiver.  What to expect:  Any bruising will usually fade within 1 to 2 weeks.   Blood that collects in the tissue (hematoma) may be painful to the touch. It should usually decrease in size and tenderness within 1 to 2 weeks.  SEEK IMMEDIATE MEDICAL CARE IF:  You have unusual pain at the groin site or down the affected leg.   You have redness, warmth, swelling, or pain at the groin site.   You have drainage (other than a small amount of blood on the dressing).   You have chills.   You have a fever or  persistent symptoms for more than 72 hours.   You have a fever and your symptoms suddenly get worse.   Your leg becomes pale, cool, tingly, or numb.   You have heavy bleeding from the site. Hold pressure on the site.  Document Released: 10/18/2010 Document Revised: 09/04/2011 Document Reviewed: 10/18/2010 [facility] Patient Information 2012 [place], [place].  Following a low fat, low cholesterol diet.   Control of diabetes.   Foot care is very important to the PVD patient. Good foot care can help prevent infection.   Medication:   Cholesterol-lowering medicine.   Blood pressure medicine.   Anti-platelet drugs.   Certain medicines may reduce symptoms of Intermittent Claudication.   Interventional/Surgical options:   Angioplasty. An Angioplasty is a procedure that inflates a balloon in the blocked artery. This opens the blocked artery to improve blood  flow.   Stent Implant. A wire mesh tube (stent) is placed in the artery. The stent expands and stays in place, allowing the artery to remain open.   Peripheral Bypass Surgery. This is a surgical procedure that reroutes the blood around a blocked artery to help improve blood flow. This type of procedure may be performed if Angioplasty or stent implants are not an option.  SEEK IMMEDIATE MEDICAL CARE IF:   You develop pain or numbness in your arms or legs.   Your arm or leg turns cold, becomes blue in color.   You develop redness, warmth, swelling and pain in your arms or legs.  MAKE SURE YOU:   Understand these instructions.   Will watch your condition.   Will get help right away if you are not doing well or get worse.  Document Released: 10/23/2004 Document Revised: 09/04/2011 Document Reviewed: 09/19/2008 [employer] Patient Information 2012 [employer], LLCGroin Site Care Refer to this sheet in the next few weeks. These instructions provide you with information on caring for yourself after your procedure. Your caregiver may  also give you more specific instructions. Your treatment has been planned according to current medical practices, but problems sometimes occur. Call your caregiver if you have any problems or questions after your procedure. HOME CARE INSTRUCTIONS  You may shower 24 hours after the procedure. Remove the bandage (dressing) and gently wash the site with plain soap and water. Gently pat the site dry.   Do not apply powder or lotion to the site.   Do not sit in a bathtub, swimming pool, or whirlpool for 5 to 7 days.   No bending, squatting, or lifting anything over 10 pounds (4.5 kg) as directed by your caregiver.   Inspect the site at least twice daily.   Do not drive home if you are discharged the same day of the procedure. Have someone else drive you.   You may drive 24 hours after the procedure unless otherwise instructed by your caregiver.  What to expect:  Any bruising will usually fade within 1 to 2 weeks.   Blood that collects in the tissue (hematoma) may be painful to the touch. It should usually decrease in size and tenderness within 1 to 2 weeks.  SEEK IMMEDIATE MEDICAL CARE IF:  You have unusual pain at the groin site or down the affected leg.   You have redness, warmth, swelling, or pain at the groin site.   You have drainage (other than a small amount of blood on the dressing).   You have chills.   You have a fever or persistent symptoms for more than 72 hours.   You have a fever and your symptoms suddenly get worse.   Your leg becomes pale, cool, tingly, or numb.   You have heavy bleeding from the site. Hold pressure on the site.  Document Released: 10/18/2010 Document Revised: 09/04/2011 Document Reviewed: 10/18/2010 [facility] Patient Information 2012 [place], [place].Marland Kitchen

## 2012-02-26 NOTE — Progress Notes (Signed)
Report received from Janice Walker,R.N. Care transferred at this time 

## 2012-02-26 NOTE — Op Note (Signed)
Joshua Cruz is a 56 y.o. male    [id number] LOCATION:  FACILITY: [facility]  PHYSICIAN: Nanetta Batty, M.D. 07-26-1956   DATE OF PROCEDURE:  02/26/2012  DATE OF DISCHARGE:  [facility]  PV Angiogram     History obtained from chart review. [name] is a 56 year old male patient of Dr. Alanda Amass is referred for peripheral angiography because of left lobectomy Caucasian. He said prior left external iliac artery PTA and stenting as well as cutting balloon arthrectomy at the origin of the left SFA. The site location is left leg with an ABI of 0.54 on the left and 0.92 on the right. 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 He presents now for angiography and potential endovascular therapy for lifestyle limiting claudication.   PROCEDURE DESCRIPTION:    The patient was brought to the second floor  [facility] in the postabsorptive state. He was  premedicated with Valium 5 mg by mouth as well as IV Benadryl, Pepcid & Medrol for contrast allergy prophylaxis.Marland Kitchen His right groin was prepped and shaved in usual sterile fashion. Xylocaine 1% was used  for local anesthesia. A 5 French sheath was inserted into the the common femoral  artery using standard Seldinger technique. 5 and pigtail catheters used for abdominal aortography with bifemoral runoff using bolus chase digital subtraction step table technique. A 5 French crossover catheter and mental catheters were used for further visualization of the distal common femoral SFA profunda trifurcation as well as the trifurcation below the knee. Visipaque allergies for the entirety of the case. Retrograde aortic pressure was monitored during the case.    HEMODYNAMICS:    AO SYSTOLIC/AO DIASTOLIC: 151/76   ANGIOGRAPHIC RESULTS:   1: Abnormal aortogram-normal renal renal arteries are normal infrarenal abdominal aorta.  2: Left lower extremity-occluded distal left common femoral artery including  the origin of the profunda and SFA with reconstitution of the above-the-knee popliteal by collaterals with three-vessel runoff.  3: Right lower extremity-diffuse 30% narrowing of the proximal and mid third of the right SFA with two-vessel runoff. The anterior tibial is occluded.   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 His past option is surgical revascularization with femoropopliteal bypass grafting. The patient Dr. Raford Pitcher daubs. He'll be discharged home later today if the patient will followup with Dr. Alanda Amass will make further recommendations. The left lung stable condition.   Runell Gess MD, [facility] 02/26/2012 2:30 PM

## 2012-03-15 ENCOUNTER — Encounter: Payer: Self-pay | Admitting: Surgery

## 2012-04-09 ENCOUNTER — Encounter: Payer: Self-pay | Admitting: Surgery

## 2012-04-12 ENCOUNTER — Encounter (INDEPENDENT_AMBULATORY_CARE_PROVIDER_SITE_OTHER): Payer: Medicaid Other | Admitting: *Deleted

## 2012-04-12 ENCOUNTER — Encounter: Payer: Self-pay | Admitting: *Deleted

## 2012-04-12 ENCOUNTER — Encounter: Payer: Self-pay | Admitting: Surgery

## 2012-04-12 ENCOUNTER — Ambulatory Visit (INDEPENDENT_AMBULATORY_CARE_PROVIDER_SITE_OTHER): Payer: Medicaid Other | Admitting: Surgery

## 2012-04-12 ENCOUNTER — Other Ambulatory Visit (INDEPENDENT_AMBULATORY_CARE_PROVIDER_SITE_OTHER): Payer: Medicaid Other | Admitting: *Deleted

## 2012-04-12 VITALS — BP 160/81 | HR 94 | Temp 98.0°F | Ht 71.0 in | Wt 240.0 lb

## 2012-04-12 DIAGNOSIS — Z01810 Encounter for preprocedural cardiovascular examination: Secondary | ICD-10-CM

## 2012-04-12 DIAGNOSIS — I739 Peripheral vascular disease, unspecified: Secondary | ICD-10-CM

## 2012-04-12 DIAGNOSIS — I70219 Atherosclerosis of native arteries of extremities with intermittent claudication, unspecified extremity: Secondary | ICD-10-CM

## 2012-04-12 NOTE — Progress Notes (Signed)
[facility] [place]   Patient name: Joshua Cruz MRN: [id number] DOB: 12/04/1955 Sex: male   Referred by: Dr. Weintraub  Reason for referral:  Chief Complaint  Patient presents with  . PVD    consideration of left fem-pop bpg/Dr. Weintraub    HISTORY OF PRESENT ILLNESS: The patient seen today for evaluation of left leg claudication. He has a history of undergoing left iliac stenting in 2007. He recently underwent an arteriogram by Dr. Berry which revealed an occluded distal common femoral artery which extended into his proximal profunda femoral artery. He also had an occluded superficial femoral artery with reconstitution of the above-knee popliteal artery. The patient states that he has claudication like symptoms at 100 feet. He describes his leg as giving out. He denies having any rest pain however he does have cramps at night. He does not have any nonhealing wounds.   For the past several months the patient has been treated for epididymitis. He still has residual scrotal pain.  The patient suffers from coronary artery disease. An echo in May 2013 demonstrated normal LV function with moderate mitral stenosis. A Myoview showed a small fixed apical thinning defect with an ejection fraction of 55%. It was nondiagnostic for ischemia. He has a history of rheumatic fever at age 9 and had mitral balloon valvuloplasty in 1986.  The patient is medically managed for his hypertension. He is on nystatin for his hypercholesterolemia. He continues to smoke cigars.  Past Medical History  Diagnosis Date  . Hypertension   . Hypercholesterolemia   . PAD (peripheral artery disease)   . Mitral stenosis   . Bell's palsy   . Epilepsy   . CVA (cerebral infarction)     X2   . Myocardial infarct   . Epididymitis April 2013  . Stroke     2 strokes in 2006    Past Surgical History  Procedure Date  . Coronary angioplasty     1976  . Pta and stenting of left iliac artery  2007    [facility]  . Pta and angioplasty of left superifical femoral artery 2007  . Colonoscopy 02/02/2012    Procedure: COLONOSCOPY;  Surgeon: Robert M Rourk, MD;  Location: [facility];  Service: Endoscopy;  Laterality: N/A;  10:45    History   Social History  . Marital Status: Married    Spouse Name: N/A    Number of Children: N/A  . Years of Education: N/A   Occupational History  . Not on file.   Social History Main Topics  . Smoking status: Current Everyday Smoker -- 15 years    Types: Cigars  . Smokeless tobacco: Former User    Types: Chew    Quit date: 09/29/1973   Comment: 8-10 cigars daily depending on pain  . Alcohol Use: No  . Drug Use: No  . Sexually Active: Not on file   Other Topics Concern  . Not on file   Social History Narrative  . No narrative on file    Family History  Problem Relation Age of Onset  . Colon cancer Father     deceased from gunshot, in late 30s  . Cancer Father   . Cancer Mother   . Hyperlipidemia Mother   . Hypertension Mother   . Other Mother     varicose veins    Allergies as of 04/12/2012 - Review Complete 04/12/2012  Allergen Reaction Noted  . Contrast media (iodinated diagnostic agents)  01/14/2012      Current Outpatient Prescriptions on File Prior to Visit  Medication Sig Dispense Refill  . amLODipine (NORVASC) 5 MG tablet Take 5 mg by mouth daily.      . atorvastatin (LIPITOR) 10 MG tablet Take 10 mg by mouth daily.      . clopidogrel (PLAVIX) 75 MG tablet Take 75 mg by mouth daily.      . penicillin v potassium (VEETID) 250 MG tablet Take 250 mg by mouth daily.       . PHENobarbital (LUMINAL) 64.8 MG tablet Take 64.8 mg by mouth 2 (two) times daily.         REVIEW OF SYSTEMS: Positive for chest pressure, shortness of breath with exertion, pain with his legs while walking, pain in his feet when lying flat. New line neuro: Weakness in his legs, numbness in his legs, slurred speech, dizziness GI: History of blood  in stool All other symptoms are negative as documented by the patient in the encounter for  PHYSICAL EXAMINATION: General: The patient appears their stated age.  Vital signs are BP 160/81  Pulse 94  Temp 98 F (36.7 C) (Oral)  Ht 5' 11" (1.803 m)  Wt 240 lb (108.863 kg)  BMI 33.47 kg/m2  SpO2 96% HEENT:  No gross abnormalities Pulmonary: Respirations are non-labored Abdomen: Soft and non-tender  Musculoskeletal: There are no major deformities.   Neurologic: No focal weakness or paresthesias are detected, Skin: There are no ulcer or rashes noted. Psychiatric: The patient has normal affect. Cardiovascular: There is a regular rate and rhythm without significant murmur appreciated. No carotid bruits. Pedal pulses are not palpable the left  Diagnostic Studies: I have reviewed [facility] which shows an ABI of 0.92 on the right and 0.5 on the left. I ordered and reviewed carotid ultrasound studies today this shows 40-59% right carotid stenosis and 1-39% left carotid stenosis. Vein mapping was ordered and reviewed this reveals an excellent left greater saphenous vein measuring 0.45-0.65.  Outside Studies/Documentation Historical records were reviewed.  They showed left leg peripheral vascular disease without ulceration. I reviewed his angiogram which details his blood flow.  Medication Changes: He will need to be off of his Plavix for 5 days prior to surgery.  Assessment:  Left leg claudication Plan: I discussed with the patient specifically what symptoms would be alleviated by proceeding with revascularization. I feel the patient also suffers from possibly arthritis or other left sided leg issues. I was specific to let him know that I would only improve his symptoms were his leg gives out with walking. I outlined 3 options with the patient for management. The first would be a trial of cilostazol and continued medical management. The second would be a femoral  endarterectomy with patch angioplasty, and the third would be femoral endarterectomy with patch angioplasty and left femoral to above-knee popliteal bypass graft. After a thorough discussion we have decided to proceed with endarterectomy and bypass. The patient does have an excellent saphenous vein. I discussed the risks and benefits of the operation which include but are not limited to infection, cardiopulmonary complications, wound healing issues, the need for long-term surveillance, the possibility of additional interventions, and the fact that he will likely have some residual left leg issues since I do not believe all of his symptoms are arterial in origin. The patient wished to get this done as soon as possible. His surgery has been scheduled for Friday, August 2. He will need to stop his Plavix 5 days prior   to his operation.     V. Wells Brabham IV, M.D. [facility] [place] Office: [phone] Pager:  [phone]    

## 2012-04-13 ENCOUNTER — Other Ambulatory Visit: Payer: Self-pay | Admitting: *Deleted

## 2012-04-19 ENCOUNTER — Encounter (HOSPITAL_COMMUNITY): Payer: Self-pay

## 2012-04-19 ENCOUNTER — Encounter (HOSPITAL_COMMUNITY)
Admission: RE | Admit: 2012-04-19 | Discharge: 2012-04-19 | Disposition: A | Discharge status: Home / Self Care | Payer: Medicaid Other | Source: Ambulatory Visit | Attending: Surgery | Admitting: Surgery

## 2012-04-19 HISTORY — DX: Shortness of breath: R06.02

## 2012-04-19 HISTORY — DX: Rheumatic mitral stenosis: I05.0

## 2012-04-19 HISTORY — DX: Calculus of gallbladder without cholecystitis without obstruction: K80.20

## 2012-04-19 HISTORY — DX: Diarrhea, unspecified: R19.7

## 2012-04-19 HISTORY — DX: Angina pectoris, unspecified: I20.9

## 2012-04-19 HISTORY — DX: Cardiac arrhythmia, unspecified: I49.9

## 2012-04-19 HISTORY — DX: Constipation, unspecified: K59.00

## 2012-04-19 LAB — URINALYSIS, ROUTINE W REFLEX MICROSCOPIC
Glucose, UA: NEGATIVE mg/dL
Ketones, ur: NEGATIVE mg/dL
Leukocytes, UA: NEGATIVE
Protein, ur: NEGATIVE mg/dL
pH: 5.5 (ref 5.0–8.0)

## 2012-04-19 LAB — COMPREHENSIVE METABOLIC PANEL
ALT: 17 U/L (ref 0–53)
Alkaline Phosphatase: 80 U/L (ref 39–117)
BUN: 8 mg/dL (ref 6–23)
CO2: 24 mEq/L (ref 19–32)
Chloride: 100 mEq/L (ref 96–112)
GFR calc Af Amer: >90 mL/min (ref >90)
Glucose, Bld: 109 mg/dL — ABNORMAL HIGH (ref 70–99)
Potassium: 4 mEq/L (ref 3.5–5.1)
Sodium: 136 mEq/L (ref 135–145)
Total Bilirubin: 0.3 mg/dL (ref 0.3–1.2)
Total Protein: 7.9 g/dL (ref 6.0–8.3)

## 2012-04-19 LAB — URINE MICROSCOPIC-ADD ON

## 2012-04-19 LAB — CBC
HCT: 51.3 % (ref 39.0–52.0)
Hemoglobin: 18.6 g/dL — ABNORMAL HIGH (ref 13.0–17.0)
MCHC: 36.3 g/dL — ABNORMAL HIGH (ref 30.0–36.0)
RBC: 5.64 MIL/uL (ref 4.22–5.81)
WBC: 8.6 10*3/uL (ref 4.0–10.5)

## 2012-04-19 LAB — TYPE AND SCREEN: ABO/RH(D): AB POS

## 2012-04-19 LAB — PROTIME-INR
INR: 0.98 (ref 0.00–1.49)
Prothrombin Time: 13.2 seconds (ref 11.6–15.2)

## 2012-04-19 NOTE — Pre-Procedure Instructions (Signed)
20 Joshua Cruz  04/19/2012   Your procedure is scheduled on:  Friday April 30, 2012.  Report to [facility] at 0800 AM.  Call this number if you have problems the morning of surgery: [phone]   Remember:   Do not eat food or drink:After Midnight.    Take these medicines the morning of surgery with A SIP OF WATER: Amlodipine (Norvasc) and Phenobarbital (Luminal)   Do not wear jewelry.  Do not wear lotions or colognes.  Men may shave face and neck.  Do not bring valuables to the hospital.  Contacts, dentures or bridgework may not be worn into surgery.  Leave suitcase in the car. After surgery it may be brought to your room.  For patients admitted to the hospital, checkout time is 11:00 AM the day of discharge.   Patients discharged the day of surgery will not be allowed to drive home.  Name and phone number of your driver:  Special Instructions: CHG Shower Use Special Wash: 1/2 bottle night before surgery and 1/2 bottle morning of surgery.   Please read over the following fact sheets that you were given: Pain Booklet, Coughing and Deep Breathing, MRSA Information and Surgical Site Infection Prevention

## 2012-04-19 NOTE — Progress Notes (Signed)
Records requested from Dr. Susa Griffins in [place], [place] at [facility] and V[facility]. Phone # [phone]. Fax # [phone].

## 2012-04-21 ENCOUNTER — Encounter (HOSPITAL_COMMUNITY): Payer: Self-pay | Admitting: Vascular Surgery

## 2012-04-21 NOTE — Consult Note (Signed)
Anesthesia Chart Review:  Patient is a 56 year old male scheduled for left CFA endarterectomy and left FPBG by Dr Myra Gianotti on 04/30/12.  History includes smoking, obesity, HTN, PAD, CVA, Epilepsy, hypercholesterolemia, epididymitis (Dr. Brunilda Payor), rheumatic fever with moderate MS and mild AS (01/2012 echo), chronic DOE, Bell's Palsy, CAD s/p angioplasty '86.  His Cardiologist is Dr. Alanda Amass [facility]).  He was last seen on 03/12/12 and felt "suitable" for vascular surgery.  EKG then showed NSR.  His last stress test was 01/28/12 and showed small fixed apical thinning, no reversible ischemia, EF 55%, low risk scan.  Echo on 02/12/12 showed moderate LVH, normal LV systolic function,  EF > 55%, impaired LV relaxation, rheumatic moderate MS, trace MR, mild AS/AR.  There were no acute abnormalities on CXR from 02/20/12.  Labs noted.  Plan to proceed.  Shonna Chock, PA-C

## 2012-04-26 NOTE — Procedures (Unsigned)
CAROTID DUPLEX EXAM  INDICATION:  Preop fem-pop bypass graft  HISTORY: Diabetes:  No Cardiac:  Yes Hypertension:  Yes Smoking:  Yes Previous Surgery: CV History:  CVA Amaurosis Fugax No, Paresthesias No, Hemiparesis No                                      RIGHT             LEFT Brachial systolic pressure: Brachial Doppler waveforms: Vertebral direction of flow:        Antegrade         Antegrade DUPLEX VELOCITIES (cm/sec) CCA peak systolic                   82                82 ECA peak systolic                   187               259 ICA peak systolic                   156               63 ICA end diastolic                   46                24 PLAQUE MORPHOLOGY:                  Heterogeneous     Heterogeneous PLAQUE AMOUNT:                      Mild to moderate  Mild PLAQUE LOCATION:                    ICA               ICA/ECA  IMPRESSION: 1. 40%-59% right internal carotid artery stenosis. 2. 1%-39% left internal carotid artery stenosis. 3. Mild left external carotid artery stenosis present. 4. Bilateral vertebral arteries are within normal limits.  ___________________________________________ V. Charlena Cross, MD  LT/MEDQ  D:  04/12/2012  T:  04/12/2012  Job:  [id number]

## 2012-04-26 NOTE — Procedures (Unsigned)
VASCULAR LAB EXAM  INDICATION:  Preop vein mapping for fem-pop bypass graft  HISTORY: Diabetes:  No Cardiac:  Yes Hypertension:  Yes  EXAM:  The left great saphenous vein is compressible.  Diameter measurements range from 0.65 to 0.27 cm.  There are branches observed in the proximal segment and just distal to the knee.  Please see attached worksheet for all measurements.  IMPRESSION:  Patent left great saphenous vein with diameter measurements as described above.  ___________________________________________ V. Charlena Cross, MD  LT/MEDQ  D:  04/12/2012  T:  04/12/2012  Job:  [id number]

## 2012-04-29 MED ORDER — DEXTROSE 5 % IV SOLN
1.5000 g | INTRAVENOUS | Status: AC
  Administered 2012-04-30 (×2): 1.5 g via INTRAVENOUS
  Filled 2012-04-29: qty 1.5

## 2012-04-29 MED ORDER — SODIUM CHLORIDE 0.9 % IV SOLN: INTRAVENOUS | Status: DC

## 2012-04-30 ENCOUNTER — Inpatient Hospital Stay (HOSPITAL_COMMUNITY)
Admission: RE | Admit: 2012-04-30 | Discharge: 2012-05-02 | DRG: 254 | Disposition: A | Discharge status: Home / Self Care | Payer: Medicaid Other | Source: Ambulatory Visit | Attending: Surgery | Admitting: Surgery

## 2012-04-30 ENCOUNTER — Encounter (HOSPITAL_COMMUNITY)
Admission: RE | Disposition: A | Discharge status: Home / Self Care | Payer: Self-pay | Source: Ambulatory Visit | Attending: Surgery

## 2012-04-30 ENCOUNTER — Encounter (HOSPITAL_COMMUNITY): Payer: Self-pay | Admitting: Vascular Surgery

## 2012-04-30 ENCOUNTER — Encounter (HOSPITAL_COMMUNITY): Payer: Self-pay | Admitting: *Deleted

## 2012-04-30 ENCOUNTER — Inpatient Hospital Stay (HOSPITAL_COMMUNITY): Payer: Medicaid Other | Admitting: Vascular Surgery

## 2012-04-30 DIAGNOSIS — I252 Old myocardial infarction: Secondary | ICD-10-CM

## 2012-04-30 DIAGNOSIS — I080 Rheumatic disorders of both mitral and aortic valves: Secondary | ICD-10-CM | POA: Diagnosis present

## 2012-04-30 DIAGNOSIS — I10 Essential (primary) hypertension: Secondary | ICD-10-CM | POA: Diagnosis present

## 2012-04-30 DIAGNOSIS — I2510 Atherosclerotic heart disease of native coronary artery without angina pectoris: Secondary | ICD-10-CM | POA: Diagnosis present

## 2012-04-30 DIAGNOSIS — Z9861 Coronary angioplasty status: Secondary | ICD-10-CM

## 2012-04-30 DIAGNOSIS — F17200 Nicotine dependence, unspecified, uncomplicated: Secondary | ICD-10-CM | POA: Diagnosis present

## 2012-04-30 DIAGNOSIS — Z8673 Personal history of transient ischemic attack (TIA), and cerebral infarction without residual deficits: Secondary | ICD-10-CM

## 2012-04-30 DIAGNOSIS — Z91041 Radiographic dye allergy status: Secondary | ICD-10-CM

## 2012-04-30 DIAGNOSIS — E7800 Pure hypercholesterolemia, unspecified: Secondary | ICD-10-CM | POA: Diagnosis present

## 2012-04-30 DIAGNOSIS — I70219 Atherosclerosis of native arteries of extremities with intermittent claudication, unspecified extremity: Principal | ICD-10-CM | POA: Diagnosis present

## 2012-04-30 DIAGNOSIS — Z7902 Long term (current) use of antithrombotics/antiplatelets: Secondary | ICD-10-CM

## 2012-04-30 DIAGNOSIS — G40909 Epilepsy, unspecified, not intractable, without status epilepticus: Secondary | ICD-10-CM | POA: Diagnosis present

## 2012-04-30 HISTORY — PX: FEMORAL-POPLITEAL BYPASS GRAFT: SHX937

## 2012-04-30 SURGERY — BYPASS GRAFT FEMORAL-POPLITEAL ARTERY
Anesthesia: General | Site: Leg Upper | Laterality: Left | Wound class: Clean

## 2012-04-30 MED ORDER — PHENOL 1.4 % MT LIQD
1.0000 | OROMUCOSAL | Status: DC | PRN
  Administered 2012-05-01: 1 via OROMUCOSAL
  Filled 2012-04-30: qty 177

## 2012-04-30 MED ORDER — ALUM & MAG HYDROXIDE-SIMETH 200-200-20 MG/5ML PO SUSP: 15.0000 mL | ORAL | Status: DC | PRN

## 2012-04-30 MED ORDER — GUAIFENESIN-DM 100-10 MG/5ML PO SYRP: 15.0000 mL | ORAL_SOLUTION | ORAL | Status: DC | PRN

## 2012-04-30 MED ORDER — CEFUROXIME SODIUM 750 MG IJ SOLR
INTRAMUSCULAR | Status: AC
  Filled 2012-04-30: qty 1500

## 2012-04-30 MED ORDER — LABETALOL HCL 5 MG/ML IV SOLN
10.0000 mg | INTRAVENOUS | Status: DC | PRN
  Filled 2012-04-30: qty 4

## 2012-04-30 MED ORDER — FENTANYL CITRATE 0.05 MG/ML IJ SOLN
INTRAMUSCULAR | Status: DC | PRN
  Administered 2012-04-30 (×5): 50 ug via INTRAVENOUS
  Administered 2012-04-30: 150 ug via INTRAVENOUS
  Administered 2012-04-30: 50 ug via INTRAVENOUS

## 2012-04-30 MED ORDER — PHENOBARBITAL 32.4 MG PO TABS
64.8000 mg | ORAL_TABLET | Freq: Two times a day (BID) | ORAL | Status: DC
  Administered 2012-05-01 (×2): 64.8 mg via ORAL
  Filled 2012-04-30 (×2): qty 2

## 2012-04-30 MED ORDER — PHENYLEPHRINE HCL 10 MG/ML IJ SOLN
INTRAMUSCULAR | Status: DC | PRN
  Administered 2012-04-30: 80 ug via INTRAVENOUS

## 2012-04-30 MED ORDER — BISACODYL 10 MG RE SUPP: 10.0000 mg | Freq: Every day | RECTAL | Status: DC | PRN

## 2012-04-30 MED ORDER — PROPOFOL 10 MG/ML IV BOLUS
INTRAVENOUS | Status: DC | PRN
  Administered 2012-04-30: 160 mg via INTRAVENOUS

## 2012-04-30 MED ORDER — DOCUSATE SODIUM 100 MG PO CAPS
100.0000 mg | ORAL_CAPSULE | Freq: Every day | ORAL | Status: DC
  Administered 2012-05-01: 100 mg via ORAL
  Filled 2012-04-30 (×2): qty 1

## 2012-04-30 MED ORDER — KCL IN DEXTROSE-NACL 20-5-0.45 MEQ/L-%-% IV SOLN
INTRAVENOUS | Status: AC
  Administered 2012-04-30: 1000 mL
  Filled 2012-04-30: qty 1000

## 2012-04-30 MED ORDER — ALBUTEROL SULFATE (5 MG/ML) 0.5% IN NEBU
INHALATION_SOLUTION | RESPIRATORY_TRACT | Status: AC
  Filled 2012-04-30: qty 0.5

## 2012-04-30 MED ORDER — ONDANSETRON HCL 4 MG/2ML IJ SOLN: 4.0000 mg | Freq: Once | INTRAMUSCULAR | Status: DC | PRN

## 2012-04-30 MED ORDER — CLOPIDOGREL BISULFATE 75 MG PO TABS
75.0000 mg | ORAL_TABLET | Freq: Every day | ORAL | Status: DC
  Administered 2012-05-01 – 2012-05-02 (×2): 75 mg via ORAL
  Filled 2012-04-30 (×2): qty 1

## 2012-04-30 MED ORDER — HYDROMORPHONE HCL PF 1 MG/ML IJ SOLN
INTRAMUSCULAR | Status: AC
  Filled 2012-04-30: qty 1

## 2012-04-30 MED ORDER — LIDOCAINE HCL 4 % MT SOLN
OROMUCOSAL | Status: DC | PRN
  Administered 2012-04-30: 4 mL via TOPICAL

## 2012-04-30 MED ORDER — ATORVASTATIN CALCIUM 10 MG PO TABS
10.0000 mg | ORAL_TABLET | Freq: Every day | ORAL | Status: DC
  Administered 2012-05-01 – 2012-05-02 (×2): 10 mg via ORAL
  Filled 2012-04-30 (×2): qty 1

## 2012-04-30 MED ORDER — HYDROMORPHONE HCL PF 1 MG/ML IJ SOLN
0.2500 mg | INTRAMUSCULAR | Status: DC | PRN
  Administered 2012-04-30 (×4): 0.5 mg via INTRAVENOUS

## 2012-04-30 MED ORDER — AMLODIPINE BESYLATE 5 MG PO TABS
5.0000 mg | ORAL_TABLET | Freq: Every day | ORAL | Status: DC
  Administered 2012-05-01 – 2012-05-02 (×2): 5 mg via ORAL
  Filled 2012-04-30 (×2): qty 1

## 2012-04-30 MED ORDER — ONDANSETRON HCL 4 MG/2ML IJ SOLN
INTRAMUSCULAR | Status: DC | PRN
  Administered 2012-04-30: 4 mg via INTRAVENOUS

## 2012-04-30 MED ORDER — SODIUM CHLORIDE 0.9 % IR SOLN
Status: DC | PRN
  Administered 2012-04-30: 12:00:00

## 2012-04-30 MED ORDER — PROTAMINE SULFATE 10 MG/ML IV SOLN
INTRAVENOUS | Status: DC | PRN
  Administered 2012-04-30 (×5): 10 mg via INTRAVENOUS

## 2012-04-30 MED ORDER — PENICILLIN V POTASSIUM 250 MG PO TABS
250.0000 mg | ORAL_TABLET | Freq: Every day | ORAL | Status: DC
  Administered 2012-05-01 – 2012-05-02 (×2): 250 mg via ORAL
  Filled 2012-04-30 (×2): qty 1

## 2012-04-30 MED ORDER — EPHEDRINE SULFATE 50 MG/ML IJ SOLN
INTRAMUSCULAR | Status: DC | PRN
  Administered 2012-04-30: 5 mg via INTRAVENOUS
  Administered 2012-04-30: 15 mg via INTRAVENOUS
  Administered 2012-04-30 (×4): 5 mg via INTRAVENOUS

## 2012-04-30 MED ORDER — HETASTARCH-ELECTROLYTES 6 % IV SOLN
INTRAVENOUS | Status: DC | PRN
  Administered 2012-04-30: 13:00:00 via INTRAVENOUS

## 2012-04-30 MED ORDER — HEMOSTATIC AGENTS (NO CHARGE) OPTIME
TOPICAL | Status: DC | PRN
  Administered 2012-04-30: 1 via TOPICAL

## 2012-04-30 MED ORDER — SODIUM CHLORIDE 0.9 % IV SOLN: 500.0000 mL | Freq: Once | INTRAVENOUS | Status: AC | PRN

## 2012-04-30 MED ORDER — POTASSIUM CHLORIDE CRYS ER 20 MEQ PO TBCR: 20.0000 meq | EXTENDED_RELEASE_TABLET | Freq: Once | ORAL | Status: AC | PRN

## 2012-04-30 MED ORDER — 0.9 % SODIUM CHLORIDE (POUR BTL) OPTIME
TOPICAL | Status: DC | PRN
  Administered 2012-04-30: 2000 mL

## 2012-04-30 MED ORDER — DOPAMINE-DEXTROSE 3.2-5 MG/ML-% IV SOLN
3.0000 ug/kg/min | INTRAVENOUS | Status: DC | PRN
  Filled 2012-04-30: qty 250

## 2012-04-30 MED ORDER — POLYETHYLENE GLYCOL 3350 17 G PO PACK
17.0000 g | PACK | Freq: Every day | ORAL | Status: DC | PRN
  Filled 2012-04-30: qty 1

## 2012-04-30 MED ORDER — ACETAMINOPHEN 650 MG RE SUPP: 325.0000 mg | RECTAL | Status: DC | PRN

## 2012-04-30 MED ORDER — ACETAMINOPHEN 325 MG PO TABS: 325.0000 mg | ORAL_TABLET | ORAL | Status: DC | PRN

## 2012-04-30 MED ORDER — IOHEXOL 300 MG/ML  SOLN
INTRAMUSCULAR | Status: AC
  Filled 2012-04-30: qty 1

## 2012-04-30 MED ORDER — HEPARIN SODIUM (PORCINE) 1000 UNIT/ML IJ SOLN
INTRAMUSCULAR | Status: DC | PRN
  Administered 2012-04-30: 2000 [IU] via INTRAVENOUS
  Administered 2012-04-30: 7000 [IU] via INTRAVENOUS

## 2012-04-30 MED ORDER — LIDOCAINE HCL (CARDIAC) 20 MG/ML IV SOLN
INTRAVENOUS | Status: DC | PRN
  Administered 2012-04-30: 80 mg via INTRAVENOUS

## 2012-04-30 MED ORDER — METOPROLOL TARTRATE 1 MG/ML IV SOLN: 2.0000 mg | INTRAVENOUS | Status: DC | PRN

## 2012-04-30 MED ORDER — KCL IN DEXTROSE-NACL 20-5-0.9 MEQ/L-%-% IV SOLN
INTRAVENOUS | Status: DC
  Filled 2012-04-30 (×6): qty 1000

## 2012-04-30 MED ORDER — ROCURONIUM BROMIDE 100 MG/10ML IV SOLN
INTRAVENOUS | Status: DC | PRN
  Administered 2012-04-30: 20 mg via INTRAVENOUS
  Administered 2012-04-30 (×3): 10 mg via INTRAVENOUS
  Administered 2012-04-30 (×2): 20 mg via INTRAVENOUS
  Administered 2012-04-30 (×2): 10 mg via INTRAVENOUS
  Administered 2012-04-30: 50 mg via INTRAVENOUS

## 2012-04-30 MED ORDER — HYDRALAZINE HCL 20 MG/ML IJ SOLN
10.0000 mg | INTRAMUSCULAR | Status: DC | PRN
  Filled 2012-04-30: qty 0.5

## 2012-04-30 MED ORDER — NEOSTIGMINE METHYLSULFATE 1 MG/ML IJ SOLN
INTRAMUSCULAR | Status: DC | PRN
  Administered 2012-04-30: 4 mg via INTRAVENOUS

## 2012-04-30 MED ORDER — MORPHINE SULFATE 2 MG/ML IJ SOLN
2.0000 mg | INTRAMUSCULAR | Status: DC | PRN
  Administered 2012-04-30 – 2012-05-01 (×2): 2 mg via INTRAVENOUS
  Filled 2012-04-30 (×2): qty 1

## 2012-04-30 MED ORDER — CEFUROXIME SODIUM 1.5 G IJ SOLR
1.5000 g | Freq: Two times a day (BID) | INTRAMUSCULAR | Status: AC
  Administered 2012-04-30 – 2012-05-01 (×2): 1.5 g via INTRAVENOUS
  Filled 2012-04-30 (×2): qty 1.5

## 2012-04-30 MED ORDER — OXYCODONE-ACETAMINOPHEN 5-325 MG PO TABS
1.0000 | ORAL_TABLET | ORAL | Status: DC | PRN
  Administered 2012-05-01 (×3): 1 via ORAL
  Filled 2012-04-30 (×3): qty 1

## 2012-04-30 MED ORDER — LACTATED RINGERS IV SOLN
INTRAVENOUS | Status: DC | PRN
  Administered 2012-04-30 (×4): via INTRAVENOUS

## 2012-04-30 MED ORDER — PANTOPRAZOLE SODIUM 40 MG PO TBEC
40.0000 mg | DELAYED_RELEASE_TABLET | Freq: Every day | ORAL | Status: DC
  Administered 2012-05-01: 40 mg via ORAL
  Filled 2012-04-30: qty 1

## 2012-04-30 MED ORDER — GLYCOPYRROLATE 0.2 MG/ML IJ SOLN
INTRAMUSCULAR | Status: DC | PRN
  Administered 2012-04-30: .5 mg via INTRAVENOUS

## 2012-04-30 MED ORDER — ONDANSETRON HCL 4 MG/2ML IJ SOLN
4.0000 mg | Freq: Four times a day (QID) | INTRAMUSCULAR | Status: DC | PRN
  Administered 2012-05-01: 4 mg via INTRAVENOUS
  Filled 2012-04-30: qty 2

## 2012-04-30 MED ORDER — ALBUMIN HUMAN 5 % IV SOLN
INTRAVENOUS | Status: DC | PRN
  Administered 2012-04-30 (×2): via INTRAVENOUS

## 2012-04-30 SURGICAL SUPPLY — 81 items
ADH SKN CLS APL DERMABOND .7 (GAUZE/BANDAGES/DRESSINGS) ×1
ADH SKN CLS LQ APL DERMABOND (GAUZE/BANDAGES/DRESSINGS) ×3
BANDAGE ELASTIC 4 VELCRO ST LF (GAUZE/BANDAGES/DRESSINGS) IMPLANT
BANDAGE ESMARK 6X9 LF (GAUZE/BANDAGES/DRESSINGS) IMPLANT
BNDG CMPR 9X6 STRL LF SNTH (GAUZE/BANDAGES/DRESSINGS)
BNDG ESMARK 6X9 LF (GAUZE/BANDAGES/DRESSINGS)
CANISTER SUCTION 2500CC (MISCELLANEOUS) ×2 IMPLANT
CATH EMB 5FR 80CM (CATHETERS) ×1 IMPLANT
CLIP TI MEDIUM 24 (CLIP) ×2 IMPLANT
CLIP TI WIDE RED SMALL 24 (CLIP) ×2 IMPLANT
CLOTH BEACON ORANGE TIMEOUT ST (SAFETY) ×2 IMPLANT
CONT SPEC 4OZ CLIKSEAL STRL BL (MISCELLANEOUS) ×2 IMPLANT
COVER PROBE W GEL 5X96 (DRAPES) ×1 IMPLANT
COVER SURGICAL LIGHT HANDLE (MISCELLANEOUS) ×2 IMPLANT
CUFF TOURNIQUET SINGLE 24IN (TOURNIQUET CUFF) IMPLANT
CUFF TOURNIQUET SINGLE 34IN LL (TOURNIQUET CUFF) IMPLANT
CUFF TOURNIQUET SINGLE 44IN (TOURNIQUET CUFF) IMPLANT
DERMABOND ADHESIVE PROPEN (GAUZE/BANDAGES/DRESSINGS) ×3
DERMABOND ADVANCED (GAUZE/BANDAGES/DRESSINGS) ×1
DERMABOND ADVANCED .7 DNX12 (GAUZE/BANDAGES/DRESSINGS) ×1 IMPLANT
DERMABOND ADVANCED .7 DNX6 (GAUZE/BANDAGES/DRESSINGS) IMPLANT
DRAIN CHANNEL 15F RND FF W/TCR (WOUND CARE) IMPLANT
DRAPE WARM FLUID 44X44 (DRAPE) ×2 IMPLANT
DRAPE X-RAY CASS 24X20 (DRAPES) IMPLANT
DRSG COVADERM 4X10 (GAUZE/BANDAGES/DRESSINGS) IMPLANT
DRSG COVADERM 4X8 (GAUZE/BANDAGES/DRESSINGS) IMPLANT
ELECT REM PT RETURN 9FT ADLT (ELECTROSURGICAL) ×2
ELECTRODE REM PT RTRN 9FT ADLT (ELECTROSURGICAL) ×1 IMPLANT
EVACUATOR SILICONE 100CC (DRAIN) IMPLANT
GEL ULTRASOUND 20GR AQUASONIC (MISCELLANEOUS) ×1 IMPLANT
GLOVE BIO SURGEON STRL SZ 6.5 (GLOVE) ×3 IMPLANT
GLOVE BIOGEL PI IND STRL 6.5 (GLOVE) IMPLANT
GLOVE BIOGEL PI IND STRL 7.5 (GLOVE) ×1 IMPLANT
GLOVE BIOGEL PI INDICATOR 6.5 (GLOVE) ×2
GLOVE BIOGEL PI INDICATOR 7.5 (GLOVE) ×1
GLOVE ECLIPSE 7.5 STRL STRAW (GLOVE) ×1 IMPLANT
GLOVE SURG SS PI 7.0 STRL IVOR (GLOVE) ×2 IMPLANT
GLOVE SURG SS PI 7.5 STRL IVOR (GLOVE) ×2 IMPLANT
GOWN PREVENTION PLUS XLARGE (GOWN DISPOSABLE) ×1 IMPLANT
GOWN PREVENTION PLUS XXLARGE (GOWN DISPOSABLE) ×2 IMPLANT
GOWN STRL NON-REIN LRG LVL3 (GOWN DISPOSABLE) ×3 IMPLANT
GOWN STRL REIN XL XLG (GOWN DISPOSABLE) ×1 IMPLANT
HEMOSTAT SNOW SURGICEL 2X4 (HEMOSTASIS) ×1 IMPLANT
HEMOSTAT SURGICEL 2X14 (HEMOSTASIS) IMPLANT
KIT BASIN OR (CUSTOM PROCEDURE TRAY) ×2 IMPLANT
KIT ROOM TURNOVER OR (KITS) ×2 IMPLANT
MARKER GRAFT CORONARY BYPASS (MISCELLANEOUS) IMPLANT
NS IRRIG 1000ML POUR BTL (IV SOLUTION) ×4 IMPLANT
PACK PERIPHERAL VASCULAR (CUSTOM PROCEDURE TRAY) ×2 IMPLANT
PAD ARMBOARD 7.5X6 YLW CONV (MISCELLANEOUS) ×4 IMPLANT
PADDING CAST COTTON 6X4 STRL (CAST SUPPLIES) IMPLANT
SET COLLECT BLD 21X3/4 12 (NEEDLE) IMPLANT
SPONGE INTESTINAL PEANUT (DISPOSABLE) ×2 IMPLANT
STAPLER VISISTAT 35W (STAPLE) IMPLANT
STOPCOCK 4 WAY LG BORE MALE ST (IV SETS) ×1 IMPLANT
SUT ETHILON 3 0 PS 1 (SUTURE) IMPLANT
SUT PROLENE 5 0 C 1 24 (SUTURE) ×2 IMPLANT
SUT PROLENE 5 0 C 1 36 (SUTURE) ×1 IMPLANT
SUT PROLENE 6 0 BV (SUTURE) ×3 IMPLANT
SUT PROLENE 6 0 C 1 24 (SUTURE) ×1 IMPLANT
SUT PROLENE 7 0 BV 1 (SUTURE) ×1 IMPLANT
SUT PROLENE 7 0 BV1 MDA (SUTURE) ×1 IMPLANT
SUT SILK 2 0 (SUTURE) ×2
SUT SILK 2 0 FS (SUTURE) ×1 IMPLANT
SUT SILK 2-0 18XBRD TIE 12 (SUTURE) IMPLANT
SUT SILK 3 0 (SUTURE) ×4
SUT SILK 3-0 18XBRD TIE 12 (SUTURE) IMPLANT
SUT SILK 4 0 (SUTURE) ×2
SUT SILK 4-0 18XBRD TIE 12 (SUTURE) IMPLANT
SUT VIC AB 2-0 CT1 27 (SUTURE) ×6
SUT VIC AB 2-0 CT1 TAPERPNT 27 (SUTURE) ×2 IMPLANT
SUT VIC AB 3-0 SH 27 (SUTURE) ×10
SUT VIC AB 3-0 SH 27X BRD (SUTURE) ×2 IMPLANT
SUT VICRYL 4-0 PS2 18IN ABS (SUTURE) ×6 IMPLANT
SYR 3ML LL SCALE MARK (SYRINGE) ×1 IMPLANT
TOWEL OR 17X24 6PK STRL BLUE (TOWEL DISPOSABLE) ×5 IMPLANT
TOWEL OR 17X26 10 PK STRL BLUE (TOWEL DISPOSABLE) ×5 IMPLANT
TRAY FOLEY CATH 14FRSI W/METER (CATHETERS) ×2 IMPLANT
TUBING EXTENTION W/L.L. (IV SETS) IMPLANT
UNDERPAD 30X30 INCONTINENT (UNDERPADS AND DIAPERS) ×2 IMPLANT
WATER STERILE IRR 1000ML POUR (IV SOLUTION) ×2 IMPLANT

## 2012-04-30 NOTE — Transfer of Care (Signed)
Immediate Anesthesia Transfer of Care Note  Patient: Joshua Cruz  Procedure(s) Performed: Procedure(s) (LRB): BYPASS GRAFT FEMORAL-POPLITEAL ARTERY (Left) ENDARTERECTOMY FEMORAL (Left)  Patient Location: PACU  Anesthesia Type: General  Level of Consciousness: awake, alert  and oriented  Airway & Oxygen Therapy: Patient Spontanous Breathing and Patient connected to face mask oxygen  Post-op Assessment: Report given to PACU RN and Post -op Vital signs reviewed and stable  Post vital signs: Reviewed and stable  Complications: No apparent anesthesia complications

## 2012-04-30 NOTE — Preoperative (Signed)
Beta Blockers   Reason not to administer Beta Blockers:Not Applicable 

## 2012-04-30 NOTE — Anesthesia Preprocedure Evaluation (Addendum)
Anesthesia Evaluation  Patient identified by MRN, date of birth, ID band Patient awake    Reviewed: Allergy & Precautions, H&P , NPO status   Airway Mallampati: II TM Distance: >3 FB Neck ROM: full    Dental  (+) Edentulous Upper and Edentulous Lower   Pulmonary shortness of breath and with exertion, Current Smoker,          Cardiovascular hypertension, Pt. on medications + angina with exertion and at rest + Past MI and + Peripheral Vascular Disease + dysrhythmias Supra Ventricular Tachycardia Rhythm:regular Rate:Normal     Neuro/Psych Seizures -, Well Controlled,  CVA, Residual Symptoms    GI/Hepatic GERD-  Poorly Controlled,  Endo/Other    Renal/GU      Musculoskeletal   Abdominal   Peds  Hematology   Anesthesia Other Findings   Reproductive/Obstetrics                          Anesthesia Physical Anesthesia Plan  ASA: III  Anesthesia Plan: General   Post-op Pain Management:    Induction: Intravenous  Airway Management Planned: Oral ETT  Additional Equipment:   Intra-op Plan:   Post-operative Plan: Extubation in OR  Informed Consent: I have reviewed the patients History and Physical, chart, labs and discussed the procedure including the risks, benefits and alternatives for the proposed anesthesia with the patient or authorized representative who has indicated his/her understanding and acceptance.     Plan Discussed with: CRNA, Anesthesiologist and Surgeon  Anesthesia Plan Comments:         Anesthesia Quick Evaluation

## 2012-04-30 NOTE — H&P (View-Only) (Signed)
Vascular and Vein Specialist of [facility]   Patient name: Joshua Cruz MRN: [id number] DOB: 1955/10/11 Sex: male   Referred by: Dr. Alanda Amass  Reason for referral:  Chief Complaint  Patient presents with  . PVD    consideration of left fem-pop bpg/Dr. Alanda Amass    HISTORY OF PRESENT ILLNESS: The patient seen today for evaluation of left leg claudication. He has a history of undergoing left iliac stenting in 2007. He recently underwent an arteriogram by Dr. Allyson Sabal which revealed an occluded distal common femoral artery which extended into his proximal profunda femoral artery. He also had an occluded superficial femoral artery with reconstitution of the above-knee popliteal artery. The patient states that he has claudication like symptoms at 100 feet. He describes his leg as giving out. He denies having any rest pain however he does have cramps at night. He does not have any nonhealing wounds.   For the past several months the patient has been treated for epididymitis. He still has residual scrotal pain.  The patient suffers from coronary artery disease. An echo in May 2013 demonstrated normal LV function with moderate mitral stenosis. A Myoview showed a small fixed apical thinning defect with an ejection fraction of 55%. It was nondiagnostic for ischemia. He has a history of rheumatic fever at age 31 and had mitral balloon valvuloplasty in 1986.  The patient is medically managed for his hypertension. He is on nystatin for his hypercholesterolemia. He continues to smoke cigars.  Past Medical History  Diagnosis Date  . Hypertension   . Hypercholesterolemia   . PAD (peripheral artery disease)   . Mitral stenosis   . Bell's palsy   . Epilepsy   . CVA (cerebral infarction)     X2   . Myocardial infarct   . Epididymitis April 2013  . Stroke     2 strokes in 2006    Past Surgical History  Procedure Date  . Coronary angioplasty     1976  . Pta and stenting of left iliac artery  2007    [place]  . Pta and angioplasty of left superifical femoral artery 2007  . Colonoscopy 02/02/2012    Procedure: COLONOSCOPY;  Surgeon: Corbin Ade, MD;  Location: [facility];  Service: Endoscopy;  Laterality: N/A;  10:45    History   Social History  . Marital Status: Married    Spouse Name: N/A    Number of Children: N/A  . Years of Education: N/A   Occupational History  . Not on file.   Social History Main Topics  . Smoking status: Current Everyday Smoker -- 15 years    Types: Cigars  . Smokeless tobacco: Former Neurosurgeon    Types: Chew    Quit date: 09/29/1973   Comment: 8-10 cigars daily depending on pain  . Alcohol Use: No  . Drug Use: No  . Sexually Active: Not on file   Other Topics Concern  . Not on file   Social History Narrative  . No narrative on file    Family History  Problem Relation Age of Onset  . Colon cancer Father     deceased from gunshot, in late 4s  . Cancer Father   . Cancer Mother   . Hyperlipidemia Mother   . Hypertension Mother   . Other Mother     varicose veins    Allergies as of 04/12/2012 - Review Complete 04/12/2012  Allergen Reaction Noted  . Contrast media (iodinated diagnostic agents)  01/14/2012  Current Outpatient Prescriptions on File Prior to Visit  Medication Sig Dispense Refill  . amLODipine (NORVASC) 5 MG tablet Take 5 mg by mouth daily.      Marland Kitchen atorvastatin (LIPITOR) 10 MG tablet Take 10 mg by mouth daily.      . clopidogrel (PLAVIX) 75 MG tablet Take 75 mg by mouth daily.      . penicillin v potassium (VEETID) 250 MG tablet Take 250 mg by mouth daily.       Marland Kitchen PHENobarbital (LUMINAL) 64.8 MG tablet Take 64.8 mg by mouth 2 (two) times daily.         REVIEW OF SYSTEMS: Positive for chest pressure, shortness of breath with exertion, pain with his legs while walking, pain in his feet when lying flat. New line neuro: Weakness in his legs, numbness in his legs, slurred speech, dizziness GI: History of blood  in stool All other symptoms are negative as documented by the patient in the encounter for  PHYSICAL EXAMINATION: General: The patient appears their stated age.  Vital signs are BP 160/81  Pulse 94  Temp 98 F (36.7 C) (Oral)  Ht 5\' 11"  (1.803 m)  Wt 240 lb (108.863 kg)  BMI 33.47 kg/m2  SpO2 96% HEENT:  No gross abnormalities Pulmonary: Respirations are non-labored Abdomen: Soft and non-tender  Musculoskeletal: There are no major deformities.   Neurologic: No focal weakness or paresthesias are detected, Skin: There are no ulcer or rashes noted. Psychiatric: The patient has normal affect. Cardiovascular: There is a regular rate and rhythm without significant murmur appreciated. No carotid bruits. Pedal pulses are not palpable the left  Diagnostic Studies: I have reviewed his Doppler study from [place] which shows an ABI of 0.92 on the right and 0.5 on the left. I ordered and reviewed carotid ultrasound studies today this shows 40-59% right carotid stenosis and 1-39% left carotid stenosis. Vein mapping was ordered and reviewed this reveals an excellent left greater saphenous vein measuring 0.45-0.65.  Outside Studies/Documentation Historical records were reviewed.  They showed left leg peripheral vascular disease without ulceration. I reviewed his angiogram which details his blood flow.  Medication Changes: He will need to be off of his Plavix for 5 days prior to surgery.  Assessment:  Left leg claudication Plan: I discussed with the patient specifically what symptoms would be alleviated by proceeding with revascularization. I feel the patient also suffers from possibly arthritis or other left sided leg issues. I was specific to let him know that I would only improve his symptoms were his leg gives out with walking. I outlined 3 options with the patient for management. The first would be a trial of cilostazol and continued medical management. The second would be a femoral  endarterectomy with patch angioplasty, and the third would be femoral endarterectomy with patch angioplasty and left femoral to above-knee popliteal bypass graft. After a thorough discussion we have decided to proceed with endarterectomy and bypass. The patient does have an excellent saphenous vein. I discussed the risks and benefits of the operation which include but are not limited to infection, cardiopulmonary complications, wound healing issues, the need for long-term surveillance, the possibility of additional interventions, and the fact that he will likely have some residual left leg issues since I do not believe all of his symptoms are arterial in origin. The patient wished to get this done as soon as possible. His surgery has been scheduled for Friday, August 2. He will need to stop his Plavix 5 days prior  to his operation.     Jorge Ny, M.D. [facility] [place] Office: [phone] Pager:  [phone]

## 2012-04-30 NOTE — Progress Notes (Signed)
Patient reporting lip numbness that started in PACU.  Did not mention it until now because he thought it was not a big deal.  Dr. Imogene Burn paged; spoke with OR nurse.  Was told to just watch it for now and Dr. Imogene Burn made note of it.  Will continue to monitor.  Grips equal; patient alert and oriented; moving all extremities.  Joshua Martens RN

## 2012-04-30 NOTE — Anesthesia Procedure Notes (Signed)
Procedure Name: Intubation Date/Time: 04/30/2012 11:42 AM Performed by: Rogelia Boga Pre-anesthesia Checklist: Patient identified, Emergency Drugs available, Suction available, Patient being monitored and Timeout performed Patient Re-evaluated:Patient Re-evaluated prior to inductionOxygen Delivery Method: Circle system utilized Preoxygenation: Pre-oxygenation with 100% oxygen Intubation Type: IV induction Ventilation: Mask ventilation with difficulty and Oral airway inserted - appropriate to patient size Laryngoscope Size: Mac and 4 Grade View: Grade II Tube type: Oral Tube size: 7.5 mm Number of attempts: 2 Airway Equipment and Method: Stylet Placement Confirmation: ETT inserted through vocal cords under direct vision,  positive ETCO2 and breath sounds checked- equal and bilateral Secured at: 22 cm Tube secured with: Tape Dental Injury: Teeth and Oropharynx as per pre-operative assessment

## 2012-04-30 NOTE — Interval H&P Note (Signed)
History and Physical Interval Note:  04/30/2012 10:25 AM  Joshua Cruz  has presented today for surgery, with the diagnosis of PVD  The various methods of treatment have been discussed with the patient and family. After consideration of risks, benefits and other options for treatment, the patient has consented to  Procedure(s) (LRB): BYPASS GRAFT FEMORAL-POPLITEAL ARTERY (Left) ENDARTERECTOMY FEMORAL (Left) as a surgical intervention .  The patient's history has been reviewed, patient examined, no change in status, stable for surgery.  I have reviewed the patient's chart and labs.  Questions were answered to the patient's satisfaction.     Carnel Stegman IV, V. WELLS

## 2012-04-30 NOTE — Anesthesia Postprocedure Evaluation (Signed)
Anesthesia Post Note  Patient: Joshua Cruz  Procedure(s) Performed: Procedure(s) (LRB): BYPASS GRAFT FEMORAL-POPLITEAL ARTERY (Left) ENDARTERECTOMY FEMORAL (Left)  Anesthesia type: general  Patient location: PACU  Post pain: Pain level controlled  Post assessment: Patient's Cardiovascular Status Stable  Last Vitals:  Filed Vitals:   04/30/12 1930  BP:   Pulse:   Temp: 36.8 C  Resp:     Post vital signs: Reviewed and stable  Level of consciousness: sedated  Complications: No apparent anesthesia complications

## 2012-05-01 DIAGNOSIS — I70219 Atherosclerosis of native arteries of extremities with intermittent claudication, unspecified extremity: Secondary | ICD-10-CM

## 2012-05-01 DIAGNOSIS — I743 Embolism and thrombosis of arteries of the lower extremities: Secondary | ICD-10-CM

## 2012-05-01 LAB — CBC
Hemoglobin: 13.8 g/dL (ref 13.0–17.0)
MCH: 32.2 pg (ref 26.0–34.0)
MCHC: 34.9 g/dL (ref 30.0–36.0)
MCV: 92.1 fL (ref 78.0–100.0)
RBC: 4.29 MIL/uL (ref 4.22–5.81)

## 2012-05-01 LAB — BASIC METABOLIC PANEL
CO2: 25 mEq/L (ref 19–32)
Calcium: 8.1 mg/dL — ABNORMAL LOW (ref 8.4–10.5)
Creatinine, Ser: 0.82 mg/dL (ref 0.50–1.35)
GFR calc non Af Amer: >90 mL/min (ref >90)
Glucose, Bld: 111 mg/dL — ABNORMAL HIGH (ref 70–99)
Sodium: 136 mEq/L (ref 135–145)

## 2012-05-01 NOTE — Progress Notes (Addendum)
[facility] [place]  Progress Note Bypass Surgery  Date of Surgery: 04/30/2012  Procedure(s): BYPASS GRAFT FEMORAL-POPLITEAL ARTERY ENDARTERECTOMY FEMORAL Surgeon: Surgeon(s): Nada Libman, MD  1 Day Post-Op  History of Present Illness  Joshua Cruz is a 56 y.o. male who is S/P Procedure(s): BYPASS GRAFT FEMORAL-POPLITEAL ARTERY ENDARTERECTOMY FEMORAL left.  The patient's pre-op symptoms of pain are Improved . Patients pain is well controlled.    VASC. LAB Studies:      pending   Imaging: No results found.  Significant Diagnostic Studies: CBC Lab Results  Component Value Date   WBC 11.9* 05/01/2012   HGB 13.8 05/01/2012   HCT 39.5 05/01/2012   MCV 92.1 05/01/2012   PLT 155 05/01/2012    BMET     Component Value Date/Time   NA 136 05/01/2012 0406   K 3.8 05/01/2012 0406   CL 102 05/01/2012 0406   CO2 25 05/01/2012 0406   GLUCOSE 111* 05/01/2012 0406   BUN 9 05/01/2012 0406   CREATININE 0.82 05/01/2012 0406   CREATININE 0.86 01/14/2012 1450   CALCIUM 8.1* 05/01/2012 0406   GFRNONAA >90 05/01/2012 0406   GFRAA >90 05/01/2012 0406    COAG Lab Results  Component Value Date   INR 0.98 04/19/2012   No results found for this basename: PTT    Physical Examination  BP Readings from Last 3 Encounters:  05/01/12 130/59  05/01/12 130/59  04/19/12 152/78   Temp Readings from Last 3 Encounters:  05/01/12 98.8 F (37.1 C) Oral  05/01/12 98.8 F (37.1 C) Oral  04/19/12 97.8 F (36.6 C) Oral   SpO2 Readings from Last 3 Encounters:  05/01/12 92%  05/01/12 92%  04/19/12 97%   Pulse Readings from Last 3 Encounters:  05/01/12 94  05/01/12 94  04/19/12 99    Pt is A&O x 3 left lower extremity: Incision/s is/are clean, dry,  and  healing without hematoma, erythema or drainage Limb is warm; with good color New complaint of decreased sensation to touch over the left knee  Left Dorsalis Pedis pulse is monophasic by Doppler LeftPosterior tibial pulse is   monophasic by Doppler     Assessment/Plan: Pt. Doing well Post-op pain is controlled Wounds are clean, dry, intact or healing well PT/OT for ambulation We will re-check the left knee for sensation changes and follow it.  It is likely temporary paresthesia. Transfer to [facility] [phone] 05/01/2012 9:17 AM  Addendum  I have independently interviewed and examined the patient, and I agree with the physician assistant's findings.  Incision c/d/i.  Palpable L DP.  Joshua Sake, MD [facility] [place] Office: [phone] Pager: [phone]  05/01/2012, 10:25 AM

## 2012-05-01 NOTE — Op Note (Signed)
[facility]  Patient name: Joshua Cruz MRN: [id number] DOB: 08-15-1956 Sex: male  04/30/2012 Pre-operative Diagnosis: Left leg claudication Post-operative diagnosis:  Same Surgeon:  Jorge Ny Assistants:  Doreatha Massed Procedure:   #1 left common femoral and profunda femoral endarterectomy with patch angioplasty   #2 left femoral to above-knee popliteal artery bypass graft with ipsilateral reversed greater saphenous vein Anesthesia:  Gen. Blood Loss:  See anesthesia record Specimens:  Femoral plaque  Findings:  Occlusion of the left common femoral artery which extended into the origin of the profunda femoral artery. I performed a endarterectomy of the common femoral and profunda femoral artery. I then used the proximal portion of the vein for the bypass graft to create the patch as well as the proximal anastomosis. The patched area of the common femoral and profunda femoral artery was approximately 3 cm in length. The distal anastomosis was to the above-knee popliteal artery. The patient had an excellent uniform in caliber vein measuring approximately 4 mm in diameter.  Indications:  The patient initially presented with lifestyle limiting symptoms of his left leg. Angiography revealed an occluded left common femoral artery as well as an occluded proximal profunda femoral artery with distal reconstitution and an occluded superficial femoral artery with reconstitution of the above-knee popliteal artery. He had three-vessel runoff. We discussed all possible options for revascularization and had decided to proceed with endarterectomy of the common femoral and profunda femoral artery as well as above-knee popliteal artery bypass graft with vein. Preoperative vein mapping indicated he had an excellent caliber vein. The risks and benefits were discussed with him in the preoperative clinic visit.  Procedure:  The patient was identified in the holding area and  taken to [facility] ROOM 16  The patient was then placed supine on the table. general anesthesia was administered.  The patient was prepped and draped in the usual sterile fashion.  A time out was called and antibiotics were administered.  I initially used ultrasound to map the course of the greater saphenous vein the left leg. I then made a longitudinal incision in the groin on the left. Cautery was used to divide the subcutaneous tissue. I dissected down to the common femoral artery. I could feel a pulse in the proximal common femoral artery but I could not distally. I dissected out the common femoral artery from the inguinal ligament down to the bifurcation and then individually isolated the profunda and superficial femoral artery. I dissected out several profunda femoral branches. Once the artery was fully dissected free attention was turned towards the saphenous vein. Within the inguinal incision I dissected free the saphenofemoral junction and the saphenous vein. There were 2 main saphenous veins. I then made an incision just above the knee. I divided the crural fascia and then entered the popliteal space. I dissected free the above-knee popliteal artery. It was soft in caliber. It had a Doppler signal at this level. Through this incision identified the saphenous vein. The saphenous vein was dissected free ligating branches between silk ties and metal clips. There did appear to be a duplicated saphenous vein in the leg. Once I had the saphenous vein fully mobilized through separate skip incisions I selected the most appropriate vein. Distally on the vein a right angle clamp was placed in the vein was transected, ligated distally with a 2-0 silk tie. A collegiate clamp was placed on the saphenofemoral junction. The vein was then transected placed on the back table.  The saphenofemoral junction was oversewn with a 5-0 Prolene in 2 layers. I then created a subsartorial tunnel between the above-knee incision and the  groin incision. At this point the patient was fully heparinized. After the heparin circulated the proximal common femoral artery was occluded with a vascular clamp as were the profunda and superficial femoral arteries and their side branches. I used a #11 blade to make an arteriotomy and Potts scissors to open the arteriotomy down on to the profunda femoral artery for approximately 1 cm. The common femoral artery was totally occluded. I then performed an endarterectomy of the common femoral artery and proximal profunda femoral artery. Once this was done I made sure that there were no mobile flaps. I tacked down the plaque in the proximal profunda femoral artery with 7-0 Prolene suture. I then placed a #5 Fogarty proximally to make sure that there was good inflow and that there was no residual plaque. Next I prepared the vein. It was distended with heparin saline. There were no leaks. The vein was marked with an 8 pen for proper orientation. I then placed the vein in a reversed fashion. I spatulated proximal 3-4 cm of the vein so that I could use it as a patch. I then used this vein to perform patch angioplasty of the common femoral artery and proximal profunda femoral artery with a running 5-0 Prolene. Prior to completion the appropriate flushing maneuvers were performed. The anastomosis was completed. It was excellent pulsatile flow through the vein graft. The vein was then brought through the previously created tunnel making sure to maintain proper orientation. I then occluded the above-knee popliteal artery with Silastic vessel loops. A #11 blade was used to make an arteriotomy which was extended longitudinally with Potts scissors. The vein was then cut to the appropriate length with the leg extended. The vein was then spatulated to fit the size of the arteriotomy. A running anastomosis was created with 6-0 Prolene. Prior to completion all flushing maneuvers were performed. The anastomosis was completed. The  patient had a palpable dorsalis pedis pulse at this time. I elected not to she and arteriogram given the patient's contrast allergy. The patient's heparin was reversed with protamine. After I was satisfied with hemostasis the vein harvest incisions were closed with 2 layers of 3-0 Vicryl. I closed the groin by reapproximating the femoral sheath with a 2-0 Vicryl. The subcutaneous tissue was then closed in multiple layers of 20 and 3-0 Vicryl and the skin was closed with 4-0 Vicryl. In the above-knee incision I reapproximated the crural fascia with 2-0 Vicryl. I closed the tunnel from the vein harvest with a 3-0 Vicryl. The subcutaneous tissue was closed with 3-0 Vicryl and the skin closed with 4-0 Vicryl. Dermabond placed on all wounds. The patient tolerated the procedure well there no complications   Disposition:  To PACU in stable condition.   Juleen China, M.D. [facility] [place] Office: [phone] Pager:  [phone]

## 2012-05-01 NOTE — Evaluation (Signed)
Occupational Therapy Evaluation and Discharge Patient Details Name: Joshua Cruz MRN: [id number] DOB: 02/16/1956 Today's Date: 05/01/2012 Time: 4098-1191 OT Time Calculation (min): 13 min  OT Assessment / Plan / Recommendation Clinical Impression  This 56 yo male s/p LLE fem pop presents to acute OT will all education completed. Will have A prn at home per his report. No further OT needs identified, will sign off.    OT Assessment  Patient does not need any further OT services    Follow Up Recommendations  No OT follow up    Barriers to Discharge      Equipment Recommendations  None recommended by OT    Recommendations for Other Services    Frequency       Precautions / Restrictions Precautions Precautions: None Restrictions Weight Bearing Restrictions: No   Pertinent Vitals/Pain LLE (groin more than lower leg)    ADL  Transfers/Ambulation Related to ADLs: Supervision which he will have at home per his report. ADL Comments: Pt unable to donn/doff socks at this point but reports his wife can A prn. He states that he is not concerned about getting up and down from his toliet and will wait until he can step into his tub (v. having a tub transfer bench).    OT Diagnosis:    OT Problem List:   OT Treatment Interventions:     OT Goals    Visit Information  Last OT Received On: 05/01/12 Assistance Needed: +1    Subjective Data  Patient Stated Goal: Go home tomorrow   Prior Functioning  Vision/Perception  Home Living Available Help at Discharge: Family;Available 24 hours/day Type of Home: Mobile home Home Access: Ramped entrance Home Layout: One level Bathroom Shower/Tub: Tub/shower unit;Curtain (says he will wait until he can step in, no DME) Bathroom Toilet: Standard (has wall and door frame to hold onto) Home Adaptive Equipment: Straight cane Prior Function Level of Independence: Needs assistance Needs Assistance: Dressing;Bathing Bath:  Moderate Dressing: Moderate Able to Take Stairs?: No Driving: Yes Vocation: On disability Communication Communication: No difficulties Dominant Hand: Right      Cognition  Overall Cognitive Status: Appears within functional limits for tasks assessed/performed Arousal/Alertness: Awake/alert Orientation Level: Appears intact for tasks assessed Behavior During Session: [facility] for tasks performed    Extremity/Trunk Assessment Right Upper Extremity Assessment RUE ROM/Strength/Tone: Within functional levels Left Upper Extremity Assessment LUE ROM/Strength/Tone: Within functional levels   Mobility Bed Mobility Bed Mobility: Supine to Sit;Sit to Supine Supine to Sit: 6: Modified independent (Device/Increase time);With rails;HOB elevated (30 degrees, increased time) Sit to Supine: 6: Modified independent (Device/Increase time);HOB elevated (30 degrees, he crawls into bed and then turns over) Transfers Transfers: Sit to Stand;Stand to Sit Sit to Stand: 5: Supervision;With upper extremity assist;From bed Stand to Sit: 5: Supervision;With upper extremity assist;To bed   Exercise    Balance    End of Session OT - End of Session Activity Tolerance: Patient limited by pain Patient left: in bed;with call bell/phone within reach Nurse Communication: Mobility status (nurse saw [place] walking in the hall)       Evette Georges 05/01/2012, 4:11 PM

## 2012-05-01 NOTE — Evaluation (Signed)
Physical Therapy Evaluation Patient Details Name: Joshua Cruz MRN: [id number] DOB: August 01, 1956 Today's Date: 05/01/2012 Time: 1191-4782 PT Time Calculation (min): 25 min  PT Assessment / Plan / Recommendation Clinical Impression  Pt s/p Left fem-pop    PT Assessment  Patient needs continued PT services    Follow Up Recommendations  Home health PT;Supervision for mobility/OOB       Equipment Recommendations  Rolling walker with 5" wheels       Frequency Min 4X/week    Precautions / Restrictions Precautions Precautions: None Restrictions Weight Bearing Restrictions: No         Mobility  Bed Mobility Bed Mobility: Not assessed Transfers Transfers: Sit to Stand;Stand to Sit Sit to Stand: 3: Mod assist;4: Min assist;With upper extremity assist;From chair/3-in-1 Stand to Sit: 4: Min assist;With upper extremity assist;To chair/3-in-1 Details for Transfer Assistance: VC for hand placement. Assist to control descent and for stability into standing.  Ambulation/Gait Ambulation/Gait Assistance: 4: Min assist Ambulation Distance (Feet): 50 Feet Assistive device: Rolling walker Ambulation/Gait Assistance Details: VC for proper sequencing and safety with RW. Min assist for stability Gait Pattern: Step-to pattern;Decreased hip/knee flexion - right;Decreased hip/knee flexion - left;Decreased stance time - right;Trunk flexed;Antalgic Gait velocity: decreased gait speed    Exercises     PT Diagnosis: Difficulty walking;Acute pain  PT Problem List: Decreased strength;Decreased activity tolerance;Decreased mobility;Decreased knowledge of use of DME;Decreased knowledge of precautions;Pain PT Treatment Interventions: DME instruction;Gait training;Functional mobility training;Therapeutic activities;Therapeutic exercise;Patient/family education   PT Goals Acute Rehab PT Goals PT Goal Formulation: With patient Time For Goal Achievement: 05/08/12 Potential to Achieve Goals:  Fair Pt will go Supine/Side to Sit: with modified independence PT Goal: Supine/Side to Sit - Progress: Goal set today Pt will go Sit to Supine/Side: with modified independence PT Goal: Sit to Supine/Side - Progress: Goal set today Pt will go Sit to Stand: with supervision PT Goal: Sit to Stand - Progress: Goal set today Pt will go Stand to Sit: with supervision PT Goal: Stand to Sit - Progress: Goal set today Pt will Transfer Bed to Chair/Chair to Bed: with supervision PT Transfer Goal: Bed to Chair/Chair to Bed - Progress: Goal set today Pt will Ambulate: >150 feet;with supervision;with least restrictive assistive device PT Goal: Ambulate - Progress: Goal set today  Visit Information  Last PT Received On: 05/01/12 Assistance Needed: +1    Subjective Data      Prior Functioning  Home Living Lives With: Spouse Available Help at Discharge: Family;Available 24 hours/day Type of Home: Mobile home Home Access: Ramped entrance Home Layout: One level Bathroom Shower/Tub: Tub/shower unit;Curtain Firefighter: Standard Bathroom Accessibility: Yes How Accessible: Accessible via walker Home Adaptive Equipment: Straight cane Prior Function Level of Independence: Needs assistance Needs Assistance: Dressing Dressing: Moderate (shoes and socks) Able to Take Stairs?: No Driving: Yes Vocation: On disability Comments: uses cane ocasionally Communication Communication: No difficulties Dominant Hand: Right    Cognition  Overall Cognitive Status: Appears within functional limits for tasks assessed/performed Arousal/Alertness: Awake/alert Orientation Level: Appears intact for tasks assessed Behavior During Session: [facility] for tasks performed    Extremity/Trunk Assessment Right Lower Extremity Assessment RLE ROM/Strength/Tone: Within functional levels RLE Sensation: WFL - Light Touch Left Lower Extremity Assessment LLE ROM/Strength/Tone: Unable to fully assess;Due to  pain;Deficits LLE ROM/Strength/Tone Deficits: Unable to test strength secondary to hip and knee pain. ROM limited. PROM 0-100 before pain LLE Sensation: WFL - Light Touch   Balance    End of Session PT - End  of Session Equipment Utilized During Treatment: Gait belt Activity Tolerance: Patient limited by pain Patient left: in chair;with call bell/phone within reach Nurse Communication: Mobility status    Milana Kidney 05/01/2012, 10:11 AM  05/01/2012 Milana Kidney DPT PAGER: [phone] OFFICE: [phone]

## 2012-05-02 MED ORDER — OXYCODONE-ACETAMINOPHEN 5-325 MG PO TABS: 1.0000 | ORAL_TABLET | ORAL | Status: AC | PRN

## 2012-05-02 NOTE — Discharge Summary (Signed)
Physician Discharge Summary  Patient ID: Joshua Cruz MRN: [id number] DOB/AGE: [birth year] 55 y.o.  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 Hypertension   . Hypercholesterolemia   . PAD (peripheral artery disease)   . Mitral stenosis   . Bell's palsy   . Epilepsy   . CVA (cerebral infarction)     X2   . Myocardial infarct   . Epididymitis April 2013  . Stroke     2 strokes in 2006  . Mitral valve stenosis   . Anginal pain   . Dysrhythmia     Atrial flutter  . Shortness of breath     With ambulation, standing  . Constipation   . Diarrhea   . Gallstone   . Aortic stenosis     mild by 01/2012 echo (Dr. Alanda Amass)   Procedures (05/01/12): #1 left common femoral and profunda femoral endarterectomy with patch angioplasty  #2 left femoral to above-knee popliteal artery bypass graft with ipsilateral reversed greater saphenous vein  Discharged Condition: stable  Hospital Course:  Joshua Cruz is 56 y.o. male who underwent the above list procedures on 05/01/12 for femoral artery occlusion with resulting intermittent claudication.  The patient has a normal post-operative course.  On POD#2, he is ambulating, tolerating his diet, and his pain is tolerable.  He has asked to be discharged.  Based on my review of his case, he is no contraindication to discharge at this point.  He will follow up in the office with Dr. Myra Gianotti in two weeks for wound check.  Significant Diagnostic Studies: CBC    Component Value Date/Time   WBC 11.9* 05/01/2012 0406   RBC 4.29 05/01/2012 0406   HGB 13.8 05/01/2012 0406   HCT 39.5 05/01/2012 0406   PLT 155 05/01/2012 0406   MCV 92.1 05/01/2012 0406   MCH 32.2 05/01/2012 0406   MCHC 34.9 05/01/2012 0406   RDW 13.1  05/01/2012 0406   LYMPHSABS 2.7 01/14/2012 1450   MONOABS 0.9 01/14/2012 1450   EOSABS 0.1 01/14/2012 1450   BASOSABS 0.1 01/14/2012 1450    BMET    Component Value Date/Time   NA 136 05/01/2012 0406   K 3.8 05/01/2012 0406   CL 102 05/01/2012 0406   CO2 25 05/01/2012 0406   GLUCOSE 111* 05/01/2012 0406   BUN 9 05/01/2012 0406   CREATININE 0.82 05/01/2012 0406   CREATININE 0.86 01/14/2012 1450   CALCIUM 8.1* 05/01/2012 0406   GFRNONAA >90 05/01/2012 0406   GFRAA >90 05/01/2012 0406    COAG Lab Results  Component Value Date   INR 0.98 04/19/2012   No results found for this basename: PTT    Disposition: 01-Home or Self Care  Discharge Orders    Future Orders Please Complete By Expires   Resume previous diet      Driving Restrictions      Comments:   No driving while taking narcotics   Lifting restrictions      Comments:   No lifting for 2 weeks   Call MD for:  temperature >100.5      Call MD for:  redness, tenderness, or signs of infection (pain, swelling, bleeding, redness, odor or green/yellow discharge around incision site)      Call MD for:  severe or increased pain, loss or decreased feeling  in affected limb(s)      Increase activity slowly      Comments:   Walk with assistance use walker or cane as needed     Medication List  As of 05/02/2012  8:38 AM   TAKE these medications         amLODipine 5 MG tablet   Commonly known as: NORVASC   Take 5 mg by mouth daily.      atorvastatin 10 MG tablet   Commonly known as: LIPITOR   Take 10 mg by mouth daily.      clopidogrel 75 MG tablet   Commonly known as: PLAVIX   Take 75 mg by mouth daily.      oxyCODONE-acetaminophen 5-325 MG per tablet   Commonly known as: PERCOCET/ROXICET   Take 1-2 tablets by mouth every 4 (four) hours as needed.      penicillin v potassium 250 MG tablet   Commonly known as: VEETID   Take 250 mg by mouth daily.      PHENobarbital 64.8 MG tablet   Commonly known as: LUMINAL   Take 64.8 mg by mouth 2  (two) times daily.           Follow-up Information    Follow up with Myra Gianotti IV, Lala Lund, MD in 2 weeks.   Contact information:   [address] [place] [address] [phone]          Signed: Nilda Simmer 05/02/2012, 8:38 AM

## 2012-05-02 NOTE — Progress Notes (Signed)
[facility]   Daily Progress Note  Assessment/Planning: POD #2 s/p L fem-AK pop BPG w/ GSV   Pt ok with pain control  Ambulating ok: no PT/OT needs  Tolerating diet  Will D/C home  Subjective  - 2 Days Post-Op  C/o incision pain but doesn't want pain meds  Objective Filed Vitals:   05/01/12 0730 05/01/12 1349 05/01/12 2313 05/02/12 0443  BP: 131/60 135/68 163/70 148/61  Pulse: 90 88 112 96  Temp:  98.7 F (37.1 C) 98.4 F (36.9 C) 98.6 F (37 C)  TempSrc:  Oral Oral Oral  Resp: 21 20 17 20   Height:      Weight:      SpO2: 89% 94% 96% 94%    Intake/Output Summary (Last 24 hours) at 05/02/12 0833 Last data filed at 05/02/12 0811  Gross per 24 hour  Intake    580 ml  Output    400 ml  Net    180 ml    PULM  CTAB CV  RRR GI  soft, NTND VASC  L groin and thigh incision c/d/i, palpable DP  Laboratory CBC    Component Value Date/Time   WBC 11.9* 05/01/2012 0406   HGB 13.8 05/01/2012 0406   HCT 39.5 05/01/2012 0406   PLT 155 05/01/2012 0406    BMET    Component Value Date/Time   NA 136 05/01/2012 0406   K 3.8 05/01/2012 0406   CL 102 05/01/2012 0406   CO2 25 05/01/2012 0406   GLUCOSE 111* 05/01/2012 0406   BUN 9 05/01/2012 0406   CREATININE 0.82 05/01/2012 0406   CREATININE 0.86 01/14/2012 1450   CALCIUM 8.1* 05/01/2012 0406   GFRNONAA >90 05/01/2012 0406   GFRAA >90 05/01/2012 0406    Leonides Sake, MD [facility] [place] Office: [phone] Pager: [phone]  05/02/2012, 8:33 AM

## 2012-05-02 NOTE — Progress Notes (Signed)
Telemetry clerk notified RN that patient had a short run of SVT, HR 127 at 0347. Strip placed in chart. Will continue to monitor.

## 2012-05-02 NOTE — Progress Notes (Signed)
Physical Therapy Treatment Patient Details Name: FIRAS GUARDADO MRN: [id number] DOB: 20-Nov-1955 Today's Date: 05/02/2012 Time: 2130-8657 PT Time Calculation (min): 11 min  PT Assessment / Plan / Recommendation Comments on Treatment Session  pt presents with L Fem-pop.  pt moving well and notes feeling better today.  pt ed on continued ambulation and ROM Independently.      Follow Up Recommendations  Supervision - Intermittent;No PT follow up    Barriers to Discharge        Equipment Recommendations  None recommended by PT    Recommendations for Other Services    Frequency Min 4X/week   Plan Discharge plan remains appropriate;Frequency remains appropriate    Precautions / Restrictions Precautions Precautions: None Restrictions Weight Bearing Restrictions: No   Pertinent Vitals/Pain Pt indicates tightness in LE LE.      Mobility  Bed Mobility Bed Mobility: Not assessed Transfers Transfers: Sit to Stand;Stand to Sit Sit to Stand: 6: Modified independent (Device/Increase time);With upper extremity assist;From chair/3-in-1 Stand to Sit: 6: Modified independent (Device/Increase time);With upper extremity assist;To chair/3-in-1 Details for Transfer Assistance: pt demos good use of UEs, just needs increased time.   Ambulation/Gait Ambulation/Gait Assistance: 5: Supervision Ambulation Distance (Feet): 200 Feet Assistive device: None Ambulation/Gait Assistance Details: pt antalgic L LE, but moves without A.   Gait Pattern: Step-through pattern;Decreased step length - right;Decreased stance time - left Stairs: No Wheelchair Mobility Wheelchair Mobility: No    Exercises     PT Diagnosis:    PT Problem List:   PT Treatment Interventions:     PT Goals Acute Rehab PT Goals Time For Goal Achievement: 05/08/12 Potential to Achieve Goals: Good PT Goal: Sit to Stand - Progress: Met PT Goal: Stand to Sit - Progress: Met PT Transfer Goal: Bed to Chair/Chair to Bed - Progress:  Met Pt will Ambulate: >150 feet;Independently PT Goal: Ambulate - Progress: Updated due to goal met  Visit Information  Last PT Received On: 05/02/12 Assistance Needed: +1    Subjective Data  Subjective: Be better once I can get home!   Cognition  Overall Cognitive Status: Appears within functional limits for tasks assessed/performed Arousal/Alertness: Awake/alert Orientation Level: Appears intact for tasks assessed Behavior During Session: [facility] for tasks performed    Balance  Balance Balance Assessed: No  End of Session PT - End of Session Equipment Utilized During Treatment: Gait belt Activity Tolerance: Patient limited by pain Patient left: in chair;with call bell/phone within reach Nurse Communication: Mobility status   GP     Sunny Schlein, [place] [phone] 05/02/2012, 8:19 AM

## 2012-05-02 NOTE — Progress Notes (Signed)
Patient ambulated with RN approximately 150 ft using rolling walker.  Patient walks well with walker but is slightly unsteady without it; had to encourage him to use it. Will continue to monitor.

## 2012-05-03 ENCOUNTER — Encounter (HOSPITAL_COMMUNITY): Payer: Self-pay | Admitting: Surgery

## 2012-05-03 NOTE — Progress Notes (Signed)
UR Completed.  Murle Hellstrom Jane [phone] 05/03/2012  

## 2012-05-05 ENCOUNTER — Telehealth: Payer: Self-pay

## 2012-05-05 NOTE — Telephone Encounter (Signed)
Phone call from pt. Voiced concern of swelling of left leg from foot to groin.  States his pain level at rest is at a "4", and with walking, increases to "8".  States he has been walking frequently, " about every 20-30 minutes". States left leg is more painful/tender.  States he has walked to and from mailbox x 2, which is "approx. 100 yds".  Denies any redness surrounding incision.  Denies fever.  States some chills.  Advised pt. he may be overdoing it with the walking.  Encouraged him to walk short intervals/distance 3-4 times/day, and to build on the time and distance "gradually".  Advised to elevate left leg above level of heart at intervals during day.  C/o numbness with elevating his leg.  Advised he may need to change position intermittently.  Encouraged pt. to give more time for expecting improvement as he is only 5 days post-op.  Will follow up per phone with pt. in 2 days and check on status at that time.  Pt. Verb. Understanding, and agrees with plan to follow-up in 2 days per phone.

## 2012-05-07 ENCOUNTER — Telehealth: Payer: Self-pay

## 2012-05-07 NOTE — Telephone Encounter (Signed)
Phone call to pt. to follow up on status.  States his (L) foot swelling has improved with elevation.  States "noticing more feeling in back of left lower leg".  States some of the leg tenderness has improved with decreased swelling.  Continues to walk intermittently, but has decreased distance, and says "I can tell when I have gone too far".  Reports and odor to the left groin area.  States "noticed a little dark drainage".  Unable to tell if there is any inflammation or irritation to left groin because "not able to see down there".   Encour. to shower with assistance, or take sponge bath, using Dial soap, rinse and dry well. Advised not to get into tub of water.  Advised to keep gauze or clean wash cloth in left groin,to keep area dry where skin folds come together.  Advised to call office if any concerns.  Reminded of f/u appt. 05/17/12 at 9:30 AM.  Verb. Understanding.

## 2012-05-14 ENCOUNTER — Encounter: Payer: Self-pay | Admitting: Surgery

## 2012-05-17 ENCOUNTER — Ambulatory Visit (INDEPENDENT_AMBULATORY_CARE_PROVIDER_SITE_OTHER): Payer: Medicaid Other | Admitting: Surgery

## 2012-05-17 ENCOUNTER — Encounter: Payer: Self-pay | Admitting: Surgery

## 2012-05-17 VITALS — BP 181/80 | HR 90 | Resp 18 | Ht 71.0 in | Wt 237.0 lb

## 2012-05-17 DIAGNOSIS — I70219 Atherosclerosis of native arteries of extremities with intermittent claudication, unspecified extremity: Secondary | ICD-10-CM

## 2012-05-17 DIAGNOSIS — I6529 Occlusion and stenosis of unspecified carotid artery: Secondary | ICD-10-CM

## 2012-05-17 DIAGNOSIS — Z48812 Encounter for surgical aftercare following surgery on the circulatory system: Secondary | ICD-10-CM

## 2012-05-17 NOTE — Progress Notes (Signed)
She is here today for followup. He is status post left common femoral and profunda femoral endarterectomy with patch angioplasty and left femoral to above-knee popliteal artery bypass graft with ipsilateral reverse greater saphenous vein on 05/01/2012. His postoperative course was uncomplicated. He is back today for followup. His only complaint is that of leg pain. He states that for the first time he can't feel his foot. He is no longer taking narcotics. He does have complaints of edema in that leg.  On examination his incisions are healing nicely. He has a palpable dorsalis pedis pulse on the left. There is 1+ edema in the left leg.  I'm pleased with the progress that he has made at this time. I feel that he is recovering as expected. I've encouraged him to stay active. He will contact me if he has any issues otherwise I will see him in 3 months. I didn't detect a stenosis on carotid ultrasound in his preoperative evaluation. He had 40-59% right carotid stenosis this will need to be reevaluated in one year.

## 2012-05-17 NOTE — Addendum Note (Signed)
Addended by: Sharee Pimple on: 05/17/2012 12:28 PM   Modules accepted: Orders

## 2012-08-13 ENCOUNTER — Encounter: Payer: Self-pay | Admitting: Vascular Surgery

## 2012-08-16 ENCOUNTER — Ambulatory Visit (INDEPENDENT_AMBULATORY_CARE_PROVIDER_SITE_OTHER): Payer: Medicaid Other | Admitting: Surgery

## 2012-08-16 ENCOUNTER — Encounter: Payer: Self-pay | Admitting: Surgery

## 2012-08-16 ENCOUNTER — Ambulatory Visit (INDEPENDENT_AMBULATORY_CARE_PROVIDER_SITE_OTHER): Payer: Medicaid Other | Admitting: Vascular Surgery

## 2012-08-16 VITALS — BP 160/70 | HR 89 | Resp 18 | Ht 71.0 in | Wt 231.0 lb

## 2012-08-16 DIAGNOSIS — I739 Peripheral vascular disease, unspecified: Secondary | ICD-10-CM

## 2012-08-16 DIAGNOSIS — Z48812 Encounter for surgical aftercare following surgery on the circulatory system: Secondary | ICD-10-CM | POA: Insufficient documentation

## 2012-08-16 DIAGNOSIS — I70219 Atherosclerosis of native arteries of extremities with intermittent claudication, unspecified extremity: Secondary | ICD-10-CM

## 2012-08-16 NOTE — Progress Notes (Signed)
[facility] [place] HISTORY AND PHYSICAL   CC:  F/u left fem pop bypass Karleen Hampshire, MD  HPI: This is a 56 y.o. male here for f/u left fem pop bypass.  He states that he has been having numbness in the anterior portion of his left leg.  States his left foot pain is much better since pre operatively.  States that he has blurry vision in each eye daily.  Denies any numbness or paraesthesias of his limbs.  States he is have SOB and DOE.  Still smokes but has cut down considerably.  States that he does not f/u with his cardiologist anymore and last visit was 9/24/3 with Dr. Alanda Amass.  States the he has not followed up with his cardiologist for his SOB/DOE.  He does have a hx of mild to moderate Mitral Stenosis and mild AS. Hx of rheumatic fever at age 77 and had mitral balloon valvuloplasty in 1986.  Carotid U/s on 04/12/12 revealed a 40-59% Right carotid stenosis and 1-39% left carotid stenosis   Past Medical History  Diagnosis Date  . Hypertension   . Hypercholesterolemia   . PAD (peripheral artery disease)   . Mitral stenosis   . Bell's palsy   . Epilepsy   . CVA (cerebral infarction)     X2   . Myocardial infarct   . Epididymitis April 2013  . Stroke     2 strokes in 2006  . Mitral valve stenosis   . Anginal pain   . Dysrhythmia     Atrial flutter  . Shortness of breath     With ambulation, standing  . Constipation   . Diarrhea   . Gallstone   . Aortic stenosis     mild by 01/2012 echo (Dr. Alanda Amass)   Past Surgical History  Procedure Date  . Pta and stenting of left iliac artery 2007    [place]  . Pta and angioplasty of left superifical femoral artery 2007  . Colonoscopy 02/02/2012    Procedure: COLONOSCOPY;  Surgeon: Corbin Ade, MD;  [facility];  Service: Endoscopy;  Laterality: N/A;  10:45  . Coronary angioplasty     1986  . Femoral-popliteal bypass graft 04/30/2012    Procedure: BYPASS GRAFT FEMORAL-POPLITEAL ARTERY;   Surgeon: Nada Libman, MD;  Location: [facility] OR;  Service: Vascular;  Laterality: Left;    Allergies  Allergen Reactions  . Contrast Media (Iodinated Diagnostic Agents)     Pt states a doctor told him he was allergic years ago/ doesn't know what reaction/ tsf    Current Outpatient Prescriptions  Medication Sig Dispense Refill  . amLODipine (NORVASC) 5 MG tablet Take 5 mg by mouth daily.      Marland Kitchen atorvastatin (LIPITOR) 10 MG tablet Take 10 mg by mouth daily.      . clopidogrel (PLAVIX) 75 MG tablet Take 75 mg by mouth daily.      Marland Kitchen PHENobarbital (LUMINAL) 64.8 MG tablet Take 64.8 mg by mouth 2 (two) times daily.      . penicillin v potassium (VEETID) 250 MG tablet Take 250 mg by mouth daily.         Family History  Problem Relation Age of Onset  . Colon cancer Father     deceased from gunshot, in late 81s  . Cancer Father   . Cancer Mother   . Hyperlipidemia Mother   . Hypertension Mother   . Other Mother     varicose veins  History   Social History  . Marital Status: Married    Spouse Name: N/A    Number of Children: N/A  . Years of Education: N/A   Occupational History  . Not on file.   Social History Main Topics  . Smoking status: Current Every Day Smoker -- 15 years    Types: Cigars  . Smokeless tobacco: Former Neurosurgeon    Types: Chew    Quit date: 09/29/1973     Comment: 3-4 cigars per day  . Alcohol Use: No  . Drug Use: No  . Sexually Active: Not on file   Other Topics Concern  . Not on file   Social History Narrative  . No narrative on file     ROS: [x]  Positive   [ ]  Negative   [ ]  All sytems reviewed and are negative  Cardiac: [x] chest pain; [] chest pressure; [] palpitations; [x] SOB lying flat; [x] DOE; Vascular:  [] pain in legs with walking; [x] pain in feet when lying flat; [x] Hx of DVT; [] Hx phlebitis; [] swelling in legs; [] varicose veins Pulmonary: [] productive cough; [] asthma; [x] wheezing Neurologic:  [] Hx CVA;  [x] weakness in arms or legs;  [x] numbness over anterior portion of LLE; [] difficulty in speaking or slurred speech; [] temporary loss of vision in one eye; [x] dizziness Hematologic:  [] bleeding problems; [] clots easily GI:  [] vomiting blood; []  blood in stool; [] PUD GU: []  Dysuria; [] hematuria; [] ESRD Psychiatric:  [] Hx major depression Integumentary:  [] rashes; [] ulcers Constitutional:  [] fever; [] chills    PHYSICAL EXAMINATION:  Filed Vitals:   08/16/12 1356  BP: 160/70  Pulse: 89  Resp: 18   Body mass index is 32.22 kg/(m^2).  General:  WDWN in NAD Gait: Normal HENT: WNL Eyes: PERRL Pulmonary: normal non-labored breathing , without Rales, rhonchi,  wheezing Cardiac: RRR, without  Murmurs, rubs or gallops; No carotid bruits Abdomen: soft, NT, no masses Skin: heat rash to left groin and lower left abdomen, ulcers noted Vascular Exam/Pulses: +palpable DP pulses bilaterally.  Surgical scars are well healed. Extremities without ischemic changes, no Gangrene , no cellulitis; no open wounds; Musculoskeletal: no muscle wasting or atrophy  Neurologic: A&O X 3; Appropriate Affect ; SENSATION: normal; MOTOR FUNCTION:  moving all extremities equally. Speech is fluent/normal  Non-Invasive Vascular Imaging:  1.  Right ABI is .86  2.  Left ABI is .83 3. Right TBI is < .69 4.  Left TBI is < .69   ASSESSMENT: 56 y.o. male who is s/p post left common femoral and profunda femoral endarterectomy with patch angioplasty and left femoral to above-knee popliteal artery bypass graft with ipsilateral reverse greater saphenous vein on 05/01/2012  PLAN:   -pt doing well from bypass stand point.  He has palpable pedal pulses and is ambulating 30-40 minutes daily. -explained to the pt the importance of tobacco cessation -f/u with Dr. Myra Gianotti for bypass duplex/ABI's in 6 months -bypass duplex not performed today b/c of medicaid denial to pay.   Doreatha Massed, PA-C [facility] [phone]  Clinic MD  Myra Gianotti  I agree with the above findings. The patient has a palpable pulse within his bypass graft. Ankle-brachial indices were obtained which showed an ABI of 0.8 but triphasic waveforms. Unfortunately, Medicaid denied approval for a duplex ultrasound. I will try to get this done in 6 months as I want to know information about his bypass graft. His claudication symptoms have resolved. He does have some residual numbness from his incision. He also complains of some shortness of breath. Overall, I think he is doing  very well. He will see me back in 6 months.  Durene Cal

## 2012-08-16 NOTE — Progress Notes (Signed)
Ankle brachial index performed @ [facility] 08/16/2012 

## 2012-08-18 NOTE — Addendum Note (Signed)
Addended by: Sharee Pimple on: 08/18/2012 09:54 AM   Modules accepted: Orders

## 2012-12-29 IMAGING — CT CT ABD-PELV W/O CM
2 of 7 series · 14 of 46 positions shown, 18 images · non-contrast
Comparison: None

CLINICAL DATA: Abdominal and right flank pain, testicular swelling

CT ABDOMEN AND PELVIS WITHOUT CONTRAST
TECHNIQUE: Multidetector CT imaging of the abdomen and pelvis was
performed following the standard protocol without intravenous
contrast. IV contrast not utilized due to remote history of
contrast reaction.  The patient drank dilute water soluble contrast
for exam without reaction.  Sagittal and coronal MPR images
reconstructed from axial data set.

[Series 2: abdomen/pelvis w/o contrast · axial · non-contrast · 0.94mm/px · z∈[-538,-108]mm · 11 of 98 slices shown, 15 images]
[im 6/98  soft-tissue]
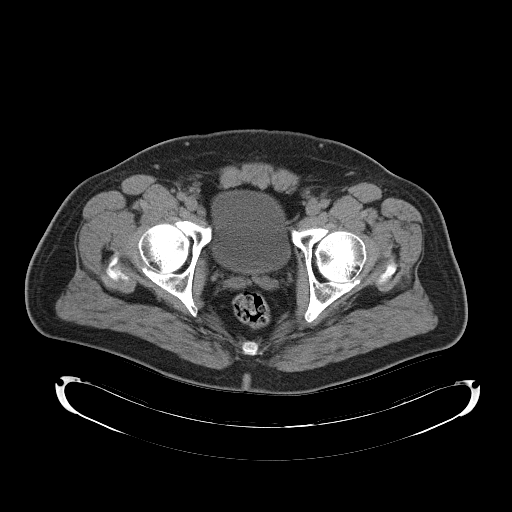
[im 6/98  bone]
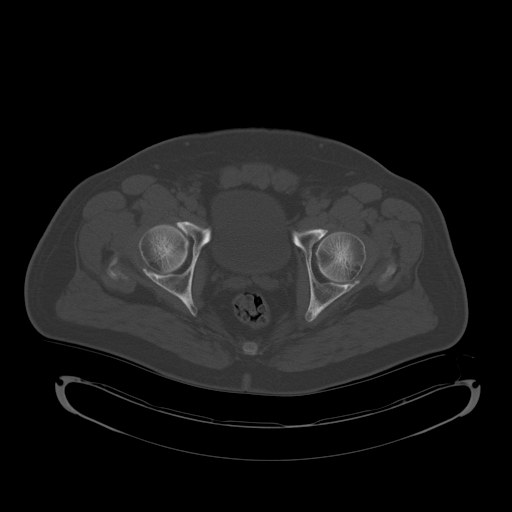
[im 18/98  soft-tissue]
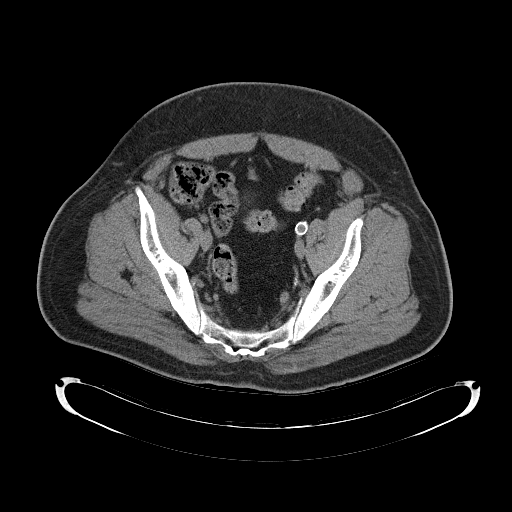
[im 29/98  soft-tissue]
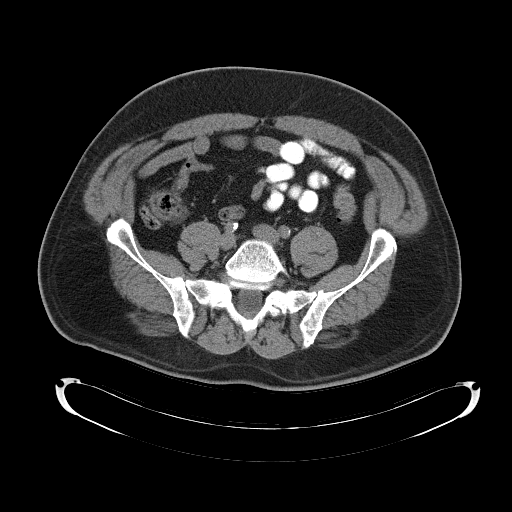
[im 40/98  soft-tissue]
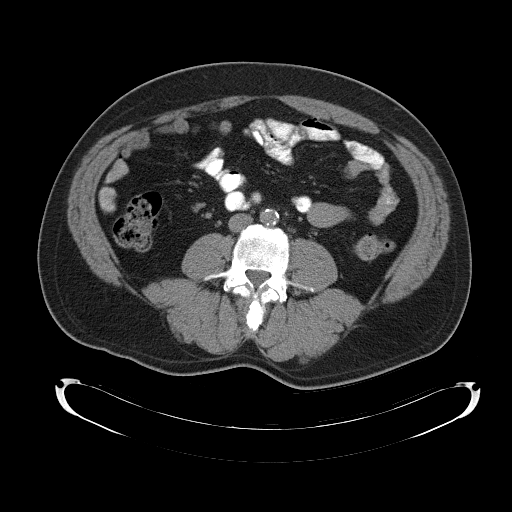
[im 52/98  soft-tissue]
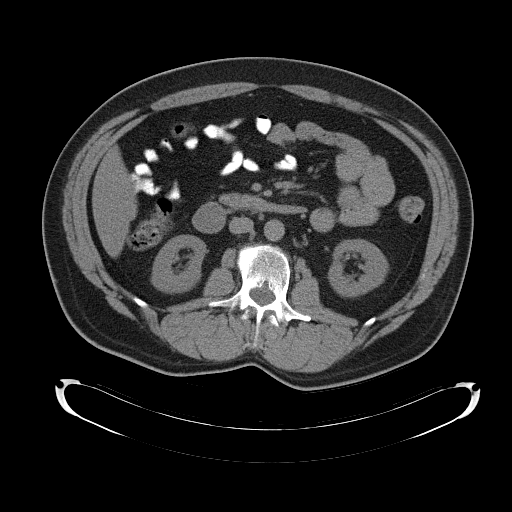
[im 58/98  soft-tissue]
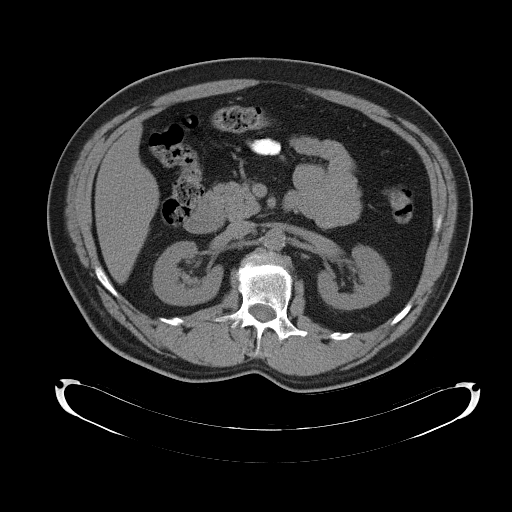
[im 69/98  soft-tissue]
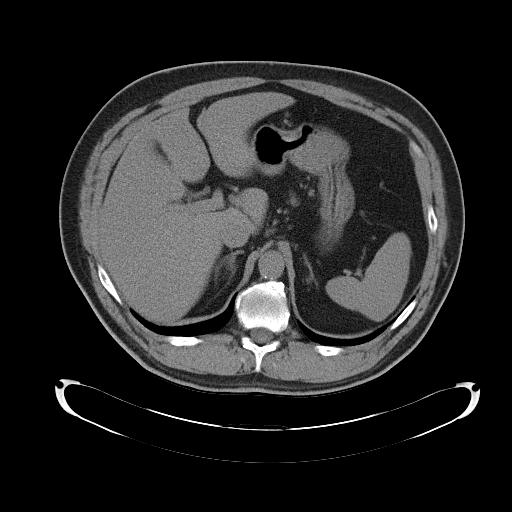
[im 75/98  lung]
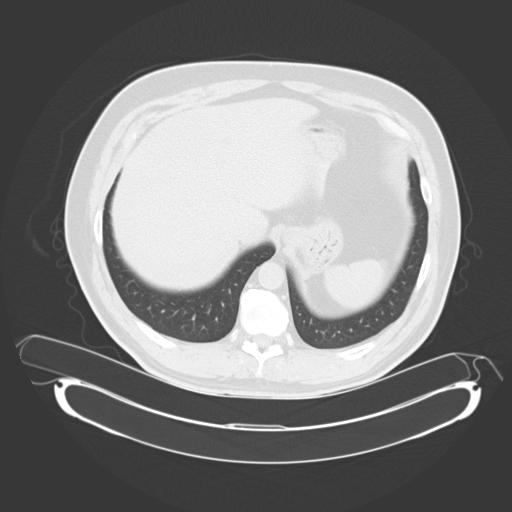
[im 80/98  soft-tissue]
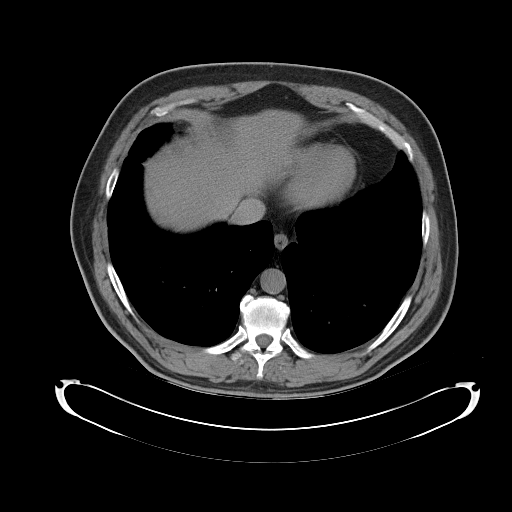
[im 80/98  lung]
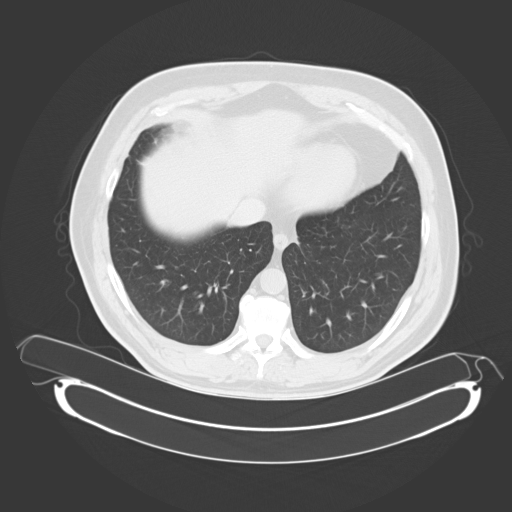
[im 86/98  lung]
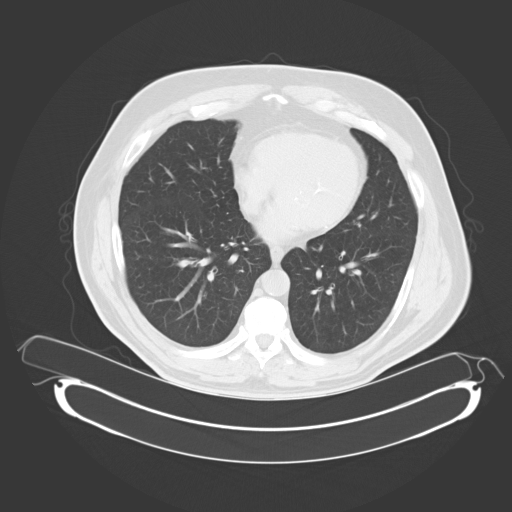
[im 92/98  soft-tissue]
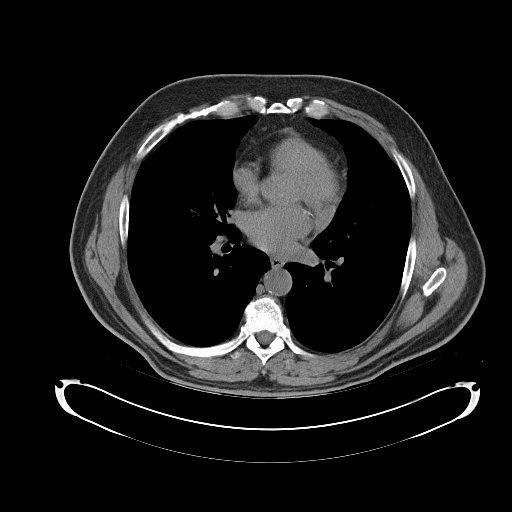
[im 92/98  lung]
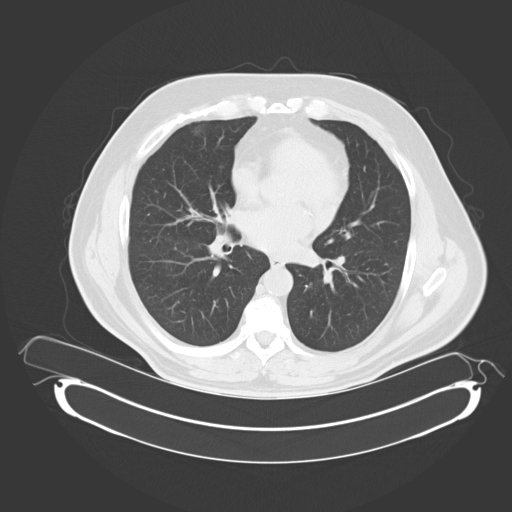
[im 92/98  bone]
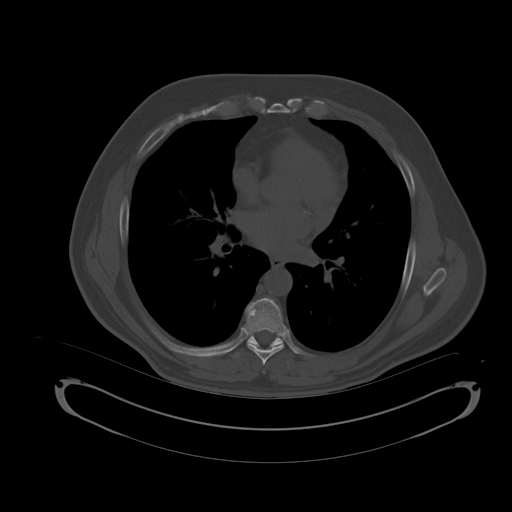

[Series 12: mpr cor (id) · coronal · 0.79mm/px · 3 of 104 slices shown]
[im 21/104  soft-tissue]
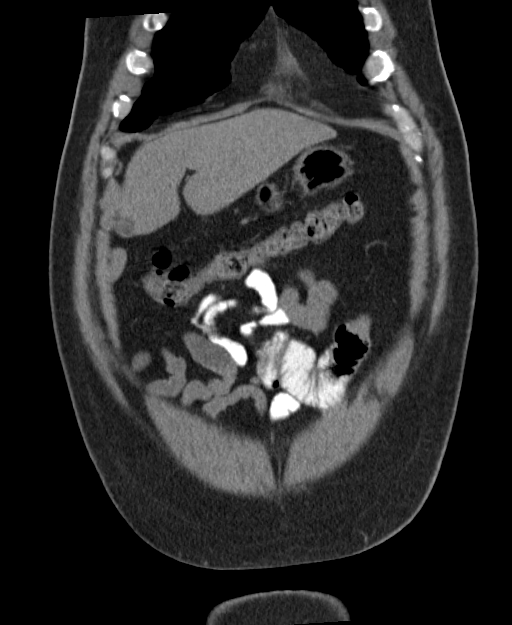
[im 42/104  soft-tissue]
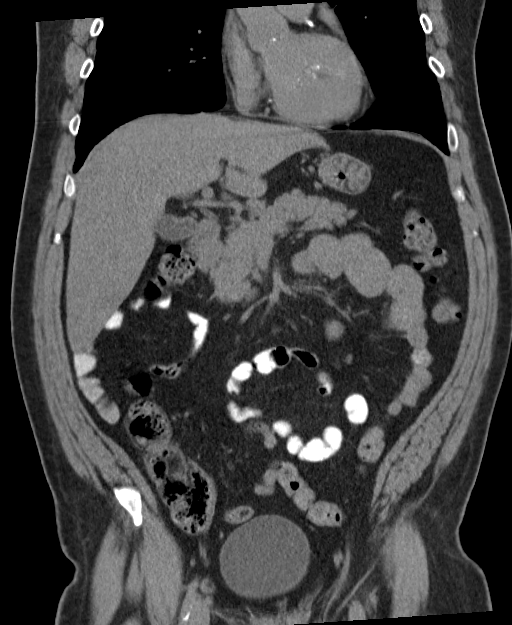
[im 62/104  soft-tissue]
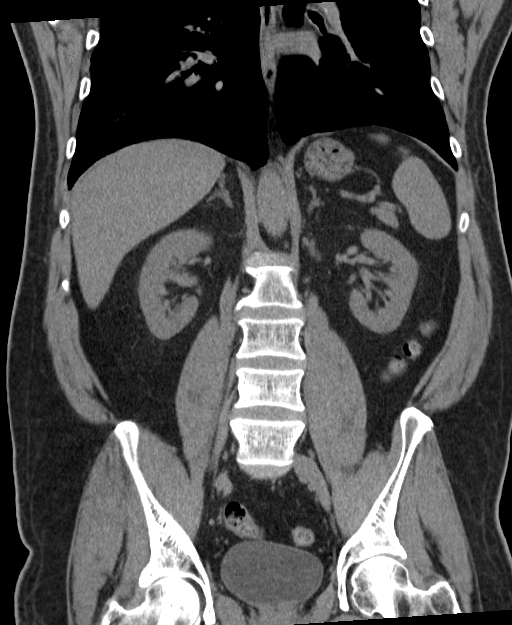

[14 of 46 positions shown; findings below may reference images not displayed]

FINDINGS: Lung bases clear.
Peripherally calcified gallstones in gallbladder up to 2.3 cm
diameter.
Within limits of a nonenhanced exam, no focal abnormalities of the
liver, spleen, pancreas, kidneys, or adrenal glands identified.
Specifically no urinary tract calcification, hydronephrosis or
ureteral dilatation.
Bladder unremarkable.

Appendix not visualized.
Stomach and bowel loops normal appearance.
Scattered atherosclerotic calcifications with wall stent at left
external iliac artery.
No mass, adenopathy, free fluid or inflammatory process.
No hernia or acute bony lesions seen.
IMPRESSION: Cholelithiasis.
No other intra abdominal or intrapelvic acute abnormalities
identified.

## 2013-01-21 ENCOUNTER — Telehealth: Payer: Self-pay | Admitting: *Deleted

## 2013-01-21 NOTE — Telephone Encounter (Signed)
Joshua Cruz walked in complaining of chest pain to our front desk personnel.Joshua Cruz came & got me & I brought the pt back to a room. As I started to hook up O2,he said it wouldn't do any good and refused to let me put the cannulae on. He then asked if he just needed to go to the hospital. Joshua Cruz walked in and asked if 911 had been called. Joshua Cruz said he would drive himself to the hospital. I told him that we could not let him do that since he had come in here; in case his pain increased; had a heart attack and wrecked. He said he would sign a waiver. I left Joshua Cruz with the patient and went to get Joshua Cruz the office manager to see if she knew of a waiver he could sign and leave. As she is getting one, Joshua Cruz walked out and told Joshua Cruz he was driving to [employer]. Joshua Cruz told him to sit down and we would get him help. He refused the O2 again. Joshua Cruz reiterated that he shouldn't drive with this complaints. He said he would be fine he drove from [place] and he was going to [facility]. He left.  He had told Joshua Cruz at the front desk that he had been to [facility] ER earlier and they said they were going to send him to [facility]. Joshua Cruz said he left there and drove here.

## 2013-02-11 ENCOUNTER — Encounter: Payer: Self-pay | Admitting: Surgery

## 2013-02-14 ENCOUNTER — Ambulatory Visit (INDEPENDENT_AMBULATORY_CARE_PROVIDER_SITE_OTHER): Payer: Medicaid Other | Admitting: Surgery

## 2013-02-14 ENCOUNTER — Encounter (INDEPENDENT_AMBULATORY_CARE_PROVIDER_SITE_OTHER): Payer: Medicaid Other | Admitting: *Deleted

## 2013-02-14 ENCOUNTER — Encounter: Payer: Self-pay | Admitting: Surgery

## 2013-02-14 VITALS — BP 132/82 | HR 89 | Resp 18 | Ht 71.0 in | Wt 238.0 lb

## 2013-02-14 DIAGNOSIS — Z48812 Encounter for surgical aftercare following surgery on the circulatory system: Secondary | ICD-10-CM

## 2013-02-14 DIAGNOSIS — I739 Peripheral vascular disease, unspecified: Secondary | ICD-10-CM

## 2013-02-14 DIAGNOSIS — R0602 Shortness of breath: Secondary | ICD-10-CM

## 2013-02-14 DIAGNOSIS — R0789 Other chest pain: Secondary | ICD-10-CM

## 2013-02-14 DIAGNOSIS — M549 Dorsalgia, unspecified: Secondary | ICD-10-CM

## 2013-02-14 NOTE — Progress Notes (Signed)
[facility]   Patient name: Joshua Cruz MRN: [id number] DOB: [birth year] Sex: male     Chief Complaint  Patient presents with  . PVD    C/O Lower back pain and chest pressure, duration 1 1/2 months.    HISTORY OF PRESENT ILLNESS: Patient comes back today for followup. He is status post left femoral endarterectomy and femoral-popliteal bypass graft in 2013. He stood up unannounced in our office on 4/25complaining of chest pain. He had been at [facility] emergency department and they recommend sending him to [facility] ER. He states that he waited there and nobody saw him and therefore he drove to our office. He fell he something sitting on his chest. He refused oxygen. He is back today for followup of his left femoropopliteal bypass graft. He states that he still gets short of breath and has chest and back pain. He states that he feels like he has a urinary tract infection because of his flank pain and frequent urination. He will occasionally use his wife's oxygen at night and his house with his breathing. He tells me that [facility] [place]. He was scheduled for a stress test but states that he physician never showed.  Past Medical History  Diagnosis Date  . Hypertension   . Hypercholesterolemia   . PAD (peripheral artery disease)   . Mitral stenosis   . Bell's palsy   . Epilepsy   . CVA (cerebral infarction)     X2   . Myocardial infarct   . Epididymitis April 2013  . Stroke     2 strokes in 2006  . Mitral valve stenosis   . Anginal pain   . Dysrhythmia     Atrial flutter  . Shortness of breath     With ambulation, standing  . Constipation   . Diarrhea   . Gallstone   . Aortic stenosis     mild by 01/2012 echo (Dr. Alanda Amass)    Past Surgical History  Procedure Laterality Date  . Pta and stenting of left iliac artery  2007    [place]  . Pta and angioplasty of left superifical femoral artery  2007  .  Colonoscopy  02/02/2012    Procedure: COLONOSCOPY;  Surgeon: Corbin Ade, MD;  [facility];  Service: Endoscopy;  Laterality: N/A;  10:45  . Coronary angioplasty      1986  . Femoral-popliteal bypass graft  04/30/2012    Procedure: BYPASS GRAFT FEMORAL-POPLITEAL ARTERY;  Surgeon: Nada Libman, MD;  Location: [facility] OR;  Service: Vascular;  Laterality: Left;    History   Social History  . Marital Status: Married    Spouse Name: N/A    Number of Children: N/A  . Years of Education: N/A   Occupational History  . Not on file.   Social History Main Topics  . Smoking status: Current Every Day Smoker -- 15 years    Types: Cigars  . Smokeless tobacco: Former Neurosurgeon    Types: Chew    Quit date: 09/29/1973     Comment: 3-4 cigars per day  . Alcohol Use: No  . Drug Use: No  . Sexually Active: Not on file   Other Topics Concern  . Not on file   Social History Narrative  . No narrative on file    Family History  Problem Relation Age of Onset  . Colon cancer Father  deceased from gunshot, in late 36s  . Cancer Father   . Cancer Mother   . Hyperlipidemia Mother   . Hypertension Mother   . Other Mother     varicose veins    Allergies as of 02/14/2013 - Review Complete 02/14/2013  Allergen Reaction Noted  . Contrast media (iodinated diagnostic agents)  01/14/2012    Current Outpatient Prescriptions on File Prior to Visit  Medication Sig Dispense Refill  . atorvastatin (LIPITOR) 10 MG tablet Take 10 mg by mouth daily.      . clopidogrel (PLAVIX) 75 MG tablet Take 75 mg by mouth daily.      . penicillin v potassium (VEETID) 250 MG tablet Take 250 mg by mouth daily.       Marland Kitchen PHENobarbital (LUMINAL) 64.8 MG tablet Take 64.8 mg by mouth 2 (two) times daily.      Marland Kitchen amLODipine (NORVASC) 5 MG tablet Take 5 mg by mouth daily.       No current facility-administered medications on file prior to visit.     REVIEW OF SYSTEMS: Please see history of present  illness  PHYSICAL EXAMINATION:   Vital signs are BP 132/82  Pulse 89  Resp 18  Ht 5\' 11"  (1.803 m)  Wt 238 lb (107.956 kg)  BMI 33.21 kg/m2  SpO2 97% General: The patient appears their stated age. HEENT:  No gross abnormalities Pulmonary:  Non labored breathing Musculoskeletal: There are no major deformities. Neurologic: No focal weakness or paresthesias are detected, Skin: There are no ulcer or rashes noted. Psychiatric: The patient has normal affect. Cardiovascular: There is a regular rate and rhythm without significant murmur appreciated. Palpable posterior tibial and dorsalis pedis pulses on the left   Diagnostic Studies ABI on the left was 0.83, on the right is 0.86. Medicaid did not approve a duplex ultrasound to evaluate his bypass graft  Assessment: Status post left femoral-popliteal bypass graft Plan: The patient's bypass graft appears to be patent. His ABIs are 0.8 range. The Medicaid did not approve a duplex ultrasound for today. I will have him come back in 6 months he needs to have his bypass graft duplex to evaluate for stenosis at the proximal or distal anastomosis or within the graft itself.  The patient feels like he has a urinary tract infection. He did not have dysuria but does complain of frequent urination and flank pain. I've given him 5 days worth of Cipro.  For the past several days to weeks the patient has been complaining of shortness of breath and questionable chest pain. He has tried to see a cardiologist in [place]. His cardiologist and Ginette Otto is Dr. Alanda Amass. I will try to contact his office today to see if he can be seen sooner. He apparently has an appointment for June 10.  Jorge Ny, M.D. [facility] [place] Office: [phone] Pager:  [phone]

## 2013-02-15 NOTE — Addendum Note (Signed)
Addended by: Sharee Pimple on: 02/15/2013 09:13 AM   Modules accepted: Orders

## 2013-02-17 ENCOUNTER — Other Ambulatory Visit (HOSPITAL_COMMUNITY): Payer: Self-pay | Admitting: Cardiovascular Disease

## 2013-02-17 ENCOUNTER — Encounter (HOSPITAL_COMMUNITY): Payer: Self-pay | Admitting: Cardiovascular Disease

## 2013-02-17 ENCOUNTER — Other Ambulatory Visit: Payer: Self-pay | Admitting: Physician Assistant

## 2013-02-17 DIAGNOSIS — R079 Chest pain, unspecified: Secondary | ICD-10-CM

## 2013-02-18 LAB — URINALYSIS W MICROSCOPIC + REFLEX CULTURE
Casts: NONE SEEN
Glucose, UA: NEGATIVE mg/dL
Hgb urine dipstick: NEGATIVE
Leukocytes, UA: NEGATIVE
pH: 5.5 (ref 5.0–8.0)

## 2013-02-24 ENCOUNTER — Ambulatory Visit (HOSPITAL_COMMUNITY)
Admission: RE | Admit: 2013-02-24 | Discharge: 2013-02-24 | Disposition: A | Discharge status: Home / Self Care | Payer: Medicaid Other | Source: Ambulatory Visit | Attending: Cardiovascular Disease | Admitting: Cardiovascular Disease

## 2013-02-24 DIAGNOSIS — I739 Peripheral vascular disease, unspecified: Secondary | ICD-10-CM | POA: Insufficient documentation

## 2013-02-24 DIAGNOSIS — E669 Obesity, unspecified: Secondary | ICD-10-CM | POA: Insufficient documentation

## 2013-02-24 DIAGNOSIS — I10 Essential (primary) hypertension: Secondary | ICD-10-CM | POA: Insufficient documentation

## 2013-02-24 DIAGNOSIS — R0602 Shortness of breath: Secondary | ICD-10-CM | POA: Insufficient documentation

## 2013-02-24 DIAGNOSIS — Z8673 Personal history of transient ischemic attack (TIA), and cerebral infarction without residual deficits: Secondary | ICD-10-CM | POA: Insufficient documentation

## 2013-02-24 DIAGNOSIS — R55 Syncope and collapse: Secondary | ICD-10-CM | POA: Insufficient documentation

## 2013-02-24 DIAGNOSIS — R079 Chest pain, unspecified: Secondary | ICD-10-CM | POA: Insufficient documentation

## 2013-02-24 DIAGNOSIS — R42 Dizziness and giddiness: Secondary | ICD-10-CM | POA: Insufficient documentation

## 2013-02-24 DIAGNOSIS — R002 Palpitations: Secondary | ICD-10-CM | POA: Insufficient documentation

## 2013-02-24 MED ORDER — AMINOPHYLLINE 25 MG/ML IV SOLN
100.0000 mg | Freq: Once | INTRAVENOUS | Status: AC
  Administered 2013-02-24: 100 mg via INTRAVENOUS

## 2013-02-24 MED ORDER — TECHNETIUM TC 99M SESTAMIBI GENERIC - CARDIOLITE
11.0000 | Freq: Once | INTRAVENOUS | Status: AC | PRN
  Administered 2013-02-24: 11 via INTRAVENOUS

## 2013-02-24 MED ORDER — TECHNETIUM TC 99M SESTAMIBI GENERIC - CARDIOLITE
31.0000 | Freq: Once | INTRAVENOUS | Status: AC | PRN
  Administered 2013-02-24: 31 via INTRAVENOUS

## 2013-02-24 MED ORDER — REGADENOSON 0.4 MG/5ML IV SOLN
0.4000 mg | Freq: Once | INTRAVENOUS | Status: AC
  Administered 2013-02-24: 0.4 mg via INTRAVENOUS

## 2013-02-24 NOTE — Procedures (Addendum)
[facility] [address] [place] [address] [phone]  Cardiology Nuclear Med Study  Joshua Cruz is a 57 y.o. 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 Weight:  238 lb (107.956 kg)   Tech Comments:  N/A    Nuclear Med Study 1 or 2 day study: 1 day  Stress Test Type:  Lexiscan  Order Authorizing Provider:  Susa Griffins, MD   Resting Radionuclide: Technetium 38m Sestamibi  Resting Radionuclide Dose: 11.0 mCi   Stress Radionuclide:  Technetium 31m Sestamibi  Stress Radionuclide Dose: 31.0 mCi           Stress Protocol Rest HR: 91 Stress HR: 102  Rest BP: 126/80 Stress BP: 65/50  Exercise Time (min): n/a METS: n/a          Dose of Adenosine (mg):  n/a Dose of Lexiscan: 0.4 mg  Dose of Atropine (mg): n/a Dose of Dobutamine: n/a mcg/kg/min (at max HR)  Stress Test Technologist: Ernestene Mention, CCT Nuclear Technologist: Gonzella Lex, CNMT   Rest Procedure:  Myocardial perfusion imaging was performed at rest 45 minutes following the intravenous administration of Technetium 82m Sestamibi. Stress Procedure:  The patient received IV Lexiscan 0.4 mg over 15-seconds.  Technetium 23m Sestamibi injected at 30-seconds.  There were no significant changes with Lexiscan.  Due to  patient's extreme shortness of breath, cough and a sudden drop in bood pressure, he was given IV Aminophylline 100 mg. Symptoms were resolved during the 27-minute recovery. Quantitative spect images were obtained after a 45 minute delay.  Transient Ischemic Dilatation (Normal <1.22):  1.08 Lung/Heart Ratio (Normal <0.45):  0.40 QGS EDV:  84 ml QGS ESV:  33 ml LV Ejection Fraction: 61%  Signed by       Rest ECG: NSR - Normal EKG  Stress ECG: No significant change from baseline ECG  QPS Raw Data Images:  Normal; no motion artifact; normal heart/lung ratio. Stress Images:  Normal homogeneous uptake in all areas of the myocardium. Rest Images:  Normal homogeneous uptake in all areas of the myocardium. Subtraction (SDS):  No evidence of ischemia.  Impression Exercise Capacity:  Lexiscan with no exercise. BP Response:  Normal blood pressure response. Clinical Symptoms:  No significant symptoms noted. ECG Impression:  No significant ST segment change suggestive of ischemia. Comparison with Prior Nuclear Study: No significant change from previous study  Overall Impression:  Normal stress nuclear study.  LV Wall Motion:  NL LV Function; NL Wall Motion   Runell Gess, MD  02/24/2013 5:23 PM

## 2013-03-03 ENCOUNTER — Encounter: Payer: Self-pay | Admitting: Cardiovascular Disease

## 2013-04-04 ENCOUNTER — Ambulatory Visit (INDEPENDENT_AMBULATORY_CARE_PROVIDER_SITE_OTHER): Payer: BC Managed Care – PPO | Admitting: Family Medicine

## 2013-04-04 ENCOUNTER — Ambulatory Visit: Payer: BC Managed Care – PPO | Admitting: Family Medicine

## 2013-04-04 ENCOUNTER — Encounter: Payer: Self-pay | Admitting: Family Medicine

## 2013-04-04 VITALS — BP 126/77 | HR 78 | Temp 97.8°F | Ht 76.0 in | Wt 212.0 lb

## 2013-04-04 DIAGNOSIS — N529 Male erectile dysfunction, unspecified: Secondary | ICD-10-CM

## 2013-04-04 DIAGNOSIS — E785 Hyperlipidemia, unspecified: Secondary | ICD-10-CM

## 2013-04-04 DIAGNOSIS — I10 Essential (primary) hypertension: Secondary | ICD-10-CM

## 2013-04-04 MED ORDER — SILDENAFIL CITRATE 100 MG PO TABS: 50.0000 mg | ORAL_TABLET | Freq: Every day | ORAL | Status: DC | PRN

## 2013-04-04 MED ORDER — PRAVASTATIN SODIUM 40 MG PO TABS: 40.0000 mg | ORAL_TABLET | Freq: Every day | ORAL | Status: DC

## 2013-04-04 MED ORDER — LISINOPRIL-HYDROCHLOROTHIAZIDE 20-12.5 MG PO TABS: 1.0000 | ORAL_TABLET | Freq: Every day | ORAL | Status: DC

## 2013-04-04 NOTE — Progress Notes (Signed)
  Subjective:    Patient ID: Corey Barajas, male    DOB: [birth year], 57 y.o.   MRN: [id number]  HPI This 57 y.o. male presents for evaluation of hypertension and hyperlipidemia.  He has been having difficulties with ED and would like to try some viagra. He has had lipid panel at his work and states that his lipids were normal.  He has not had PSA since 2010 and is due for colonoscopy.   Review of Systems No chest pain, SOB, HA, dizziness, vision change, N/V, diarrhea, constipation, dysuria, urinary urgency or frequency, myalgias, arthralgias or rash.     Objective:   Physical Exam  Vital signs noted  Well developed well nourished male.  HEENT - Head atraumatic Normocephalic                Eyes - PERRLA, Conjuctiva - clear Sclera- Clear EOMI                Ears - EAC's Wnl TM's Wnl Gross Hearing WNL                Nose - Nares patent                 Throat - oropharanx wnl Respiratory - Lungs CTA bilateral Cardiac - RRR S1 and S2 without murmur GI - Abdomen soft Nontender and bowel sounds active x 4 Extremities - No edema. Neuro - Grossly intact.      Assessment & Plan:  Erectile dysfunction - Plan: sildenafil (VIAGRA) 100 MG tablet,  viagra coupon given for 3 free viagra samples.  Other and unspecified hyperlipidemia - Plan: Continue Pravastatin, CBC with Differential, TSH, COMPLETE METABOLIC PANEL WITH GFR, PSA  Essential hypertension, benign - Plan: lisinopril-hydrochlorothiazide (PRINZIDE,ZESTORETIC) 20-12.5 MG per tablet, CBC with Differential, TSH, COMPLETE METABOLIC PANEL WITH GFR, PSA  Discussed getting colonoscopy and he wants to wait.  Discussed that he needs to see his eye doctor anually, and to see his dentist anually.  Follow up in 6 months.

## 2013-04-04 NOTE — Patient Instructions (Signed)
Hypertriglyceridemia  Diet for High blood levels of Triglycerides Most fats in food are triglycerides. Triglycerides in your blood are stored as fat in your body. High levels of triglycerides in your blood may put you at a greater risk for heart disease and stroke.  Normal triglyceride levels are less than 150 mg/dL. Borderline high levels are 150-199 mg/dl. High levels are 200 - 499 mg/dL, and very high triglyceride levels are greater than 500 mg/dL. The decision to treat high triglycerides is generally based on the level. For people with borderline or high triglyceride levels, treatment includes weight loss and exercise. Drugs are recommended for people with very high triglyceride levels. Many people who need treatment for high triglyceride levels have metabolic syndrome. This syndrome is a collection of disorders that often include: insulin resistance, high blood pressure, blood clotting problems, high cholesterol and triglycerides. TESTING PROCEDURE FOR TRIGLYCERIDES  You should not eat 4 hours before getting your triglycerides measured. The normal range of triglycerides is between 10 and 250 milligrams per deciliter (mg/dl). Some people may have extreme levels (1000 or above), but your triglyceride level may be too high if it is above 150 mg/dl, depending on what other risk factors you have for heart disease.  People with high blood triglycerides may also have high blood cholesterol levels. If you have high blood cholesterol as well as high blood triglycerides, your risk for heart disease is probably greater than if you only had high triglycerides. High blood cholesterol is one of the main risk factors for heart disease. CHANGING YOUR DIET  Your weight can affect your blood triglyceride level. If you are more than 20% above your ideal body weight, you may be able to lower your blood triglycerides by losing weight. Eating less and exercising regularly is the best way to combat this. Fat provides more  calories than any other food. The best way to lose weight is to eat less fat. Only 30% of your total calories should come from fat. Less than 7% of your diet should come from saturated fat. A diet low in fat and saturated fat is the same as a diet to decrease blood cholesterol. By eating a diet lower in fat, you may lose weight, lower your blood cholesterol, and lower your blood triglyceride level.  Eating a diet low in fat, especially saturated fat, may also help you lower your blood triglyceride level. Ask your dietitian to help you figure how much fat you can eat based on the number of calories your caregiver has prescribed for you.  Exercise, in addition to helping with weight loss may also help lower triglyceride levels.   Alcohol can increase blood triglycerides. You may need to stop drinking alcoholic beverages.  Too much carbohydrate in your diet may also increase your blood triglycerides. Some complex carbohydrates are necessary in your diet. These may include bread, rice, potatoes, other starchy vegetables and cereals.  Reduce "simple" carbohydrates. These may include pure sugars, candy, honey, and jelly without losing other nutrients. If you have the kind of high blood triglycerides that is affected by the amount of carbohydrates in your diet, you will need to eat less sugar and less high-sugar foods. Your caregiver can help you with this.  Adding 2-4 grams of fish oil (EPA+ DHA) may also help lower triglycerides. Speak with your caregiver before adding any supplements to your regimen. Following the Diet  Maintain your ideal weight. Your caregivers can help you with a diet. Generally, eating less food and getting more   exercise will help you lose weight. Joining a weight control group may also help. Ask your caregivers for a good weight control group in your area.  Eat low-fat foods instead of high-fat foods. This can help you lose weight too.  These foods are lower in fat. Eat MORE of these:    Dried beans, peas, and lentils.  Egg whites.  Low-fat cottage cheese.  Fish.  Lean cuts of meat, such as round, sirloin, rump, and flank (cut extra fat off meat you fix).  Whole grain breads, cereals and pasta.  Skim and nonfat dry milk.  Low-fat yogurt.  Poultry without the skin.  Cheese made with skim or part-skim milk, such as mozzarella, parmesan, farmers', ricotta, or pot cheese. These are higher fat foods. Eat LESS of these:   Whole milk and foods made from whole milk, such as American, blue, cheddar, monterey jack, and swiss cheese  High-fat meats, such as luncheon meats, sausages, knockwurst, bratwurst, hot dogs, ribs, corned beef, ground pork, and regular ground beef.  Fried foods. Limit saturated fats in your diet. Substituting unsaturated fat for saturated fat may decrease your blood triglyceride level. You will need to read package labels to know which products contain saturated fats.  These foods are high in saturated fat. Eat LESS of these:   Fried pork skins.  Whole milk.  Skin and fat from poultry.  Palm oil.  Butter.  Shortening.  Cream cheese.  Bacon.  Margarines and baked goods made from listed oils.  Vegetable shortenings.  Chitterlings.  Fat from meats.  Coconut oil.  Palm kernel oil.  Lard.  Cream.  Sour cream.  Fatback.  Coffee whiteners and non-dairy creamers made with these oils.  Cheese made from whole milk. Use unsaturated fats (both polyunsaturated and monounsaturated) moderately. Remember, even though unsaturated fats are better than saturated fats; you still want a diet low in total fat.  These foods are high in unsaturated fat:   Canola oil.  Sunflower oil.  Mayonnaise.  Almonds.  Peanuts.  Pine nuts.  Margarines made with these oils.  Safflower oil.  Olive oil.  Avocados.  Cashews.  Peanut butter.  Sunflower seeds.  Soybean oil.  Peanut  oil.  Olives.  Pecans.  Walnuts.  Pumpkin seeds. Avoid sugar and other high-sugar foods. This will decrease carbohydrates without decreasing other nutrients. Sugar in your food goes rapidly to your blood. When there is excess sugar in your blood, your liver may use it to make more triglycerides. Sugar also contains calories without other important nutrients.  Eat LESS of these:   Sugar, brown sugar, powdered sugar, jam, jelly, preserves, honey, syrup, molasses, pies, candy, cakes, cookies, frosting, pastries, colas, soft drinks, punches, fruit drinks, and regular gelatin.  Avoid alcohol. Alcohol, even more than sugar, may increase blood triglycerides. In addition, alcohol is high in calories and low in nutrients. Ask for sparkling water, or a diet soft drink instead of an alcoholic beverage. Suggestions for planning and preparing meals   Bake, broil, grill or roast meats instead of frying.  Remove fat from meats and skin from poultry before cooking.  Add spices, herbs, lemon juice or vinegar to vegetables instead of salt, rich sauces or gravies.  Use a non-stick skillet without fat or use no-stick sprays.  Cool and refrigerate stews and broth. Then remove the hardened fat floating on the surface before serving.  Refrigerate meat drippings and skim off fat to make low-fat gravies.  Serve more fish.  Use less butter,   margarine and other high-fat spreads on bread or vegetables.  Use skim or reconstituted non-fat dry milk for cooking.  Cook with low-fat cheeses.  Substitute low-fat yogurt or cottage cheese for all or part of the sour cream in recipes for sauces, dips or congealed salads.  Use half yogurt/half mayonnaise in salad recipes.  Substitute evaporated skim milk for cream. Evaporated skim milk or reconstituted non-fat dry milk can be whipped and substituted for whipped cream in certain recipes.  Choose fresh fruits for dessert instead of high-fat foods such as pies or  cakes. Fruits are naturally low in fat. When Dining Out   Order low-fat appetizers such as fruit or vegetable juice, pasta with vegetables or tomato sauce.  Select clear, rather than cream soups.  Ask that dressings and gravies be served on the side. Then use less of them.  Order foods that are baked, broiled, poached, steamed, stir-fried, or roasted.  Ask for margarine instead of butter, and use only a small amount.  Drink sparkling water, unsweetened tea or coffee, or diet soft drinks instead of alcohol or other sweet beverages. QUESTIONS AND ANSWERS ABOUT OTHER FATS IN THE BLOOD: SATURATED FAT, TRANS FAT, AND CHOLESTEROL What is trans fat? Trans fat is a type of fat that is formed when vegetable oil is hardened through a process called hydrogenation. This process helps makes foods more solid, gives them shape, and prolongs their shelf life. Trans fats are also called hydrogenated or partially hydrogenated oils.  What do saturated fat, trans fat, and cholesterol in foods have to do with heart disease? Saturated fat, trans fat, and cholesterol in the diet all raise the level of LDL "bad" cholesterol in the blood. The higher the LDL cholesterol, the greater the risk for coronary heart disease (CHD). Saturated fat and trans fat raise LDL similarly.  What foods contain saturated fat, trans fat, and cholesterol? High amounts of saturated fat are found in animal products, such as fatty cuts of meat, chicken skin, and full-fat dairy products like butter, whole milk, cream, and cheese, and in tropical vegetable oils such as palm, palm kernel, and coconut oil. Trans fat is found in some of the same foods as saturated fat, such as vegetable shortening, some margarines (especially hard or stick margarine), crackers, cookies, baked goods, fried foods, salad dressings, and other processed foods made with partially hydrogenated vegetable oils. Small amounts of trans fat also occur naturally in some animal  products, such as milk products, beef, and lamb. Foods high in cholesterol include liver, other organ meats, egg yolks, shrimp, and full-fat dairy products. How can I use the new food label to make heart-healthy food choices? Check the Nutrition Facts panel of the food label. Choose foods lower in saturated fat, trans fat, and cholesterol. For saturated fat and cholesterol, you can also use the Percent Daily Value (%DV): 5% DV or less is low, and 20% DV or more is high. (There is no %DV for trans fat.) Use the Nutrition Facts panel to choose foods low in saturated fat and cholesterol, and if the trans fat is not listed, read the ingredients and limit products that list shortening or hydrogenated or partially hydrogenated vegetable oil, which tend to be high in trans fat. POINTS TO REMEMBER:   Discuss your risk for heart disease with your caregivers, and take steps to reduce risk factors.  Change your diet. Choose foods that are low in saturated fat, trans fat, and cholesterol.  Add exercise to your daily routine if   it is not already being done. Participate in physical activity of moderate intensity, like brisk walking, for at least 30 minutes on most, and preferably all days of the week. No time? Break the 30 minutes into three, 10-minute segments during the day.  Stop smoking. If you do smoke, contact your caregiver to discuss ways in which they can help you quit.  Do not use street drugs.  Maintain a normal weight.  Maintain a healthy blood pressure.  Keep up with your blood work for checking the fats in your blood as directed by your caregiver. Document Released: 07/03/2004 Document Revised: 03/16/2012 Document Reviewed: 01/29/2009 [employer] Patient Information 2014 [employer].  

## 2013-04-05 LAB — CBC WITH DIFFERENTIAL/PLATELET
Basophils Absolute: 0 10*3/uL (ref 0.0–0.1)
Basophils Relative: 0 % (ref 0–1)
Eosinophils Absolute: 0.2 10*3/uL (ref 0.0–0.7)
Eosinophils Relative: 3 % (ref 0–5)
HCT: 38.9 % — ABNORMAL LOW (ref 39.0–52.0)
Hemoglobin: 12.9 g/dL — ABNORMAL LOW (ref 13.0–17.0)
Lymphocytes Relative: 35 % (ref 12–46)
Lymphs Abs: 3 10*3/uL (ref 0.7–4.0)
MCH: 28.2 pg (ref 26.0–34.0)
MCHC: 33.2 g/dL (ref 30.0–36.0)
MCV: 85.1 fL (ref 78.0–100.0)
Monocytes Absolute: 0.7 10*3/uL (ref 0.1–1.0)
Monocytes Relative: 9 % (ref 3–12)
Neutro Abs: 4.5 10*3/uL (ref 1.7–7.7)
Neutrophils Relative %: 53 % (ref 43–77)
Platelets: 205 10*3/uL (ref 150–400)
RBC: 4.57 MIL/uL (ref 4.22–5.81)
RDW: 14.2 % (ref 11.5–15.5)
WBC: 8.5 10*3/uL (ref 4.0–10.5)

## 2013-04-05 LAB — COMPLETE METABOLIC PANEL WITH GFR
ALT: 20 U/L (ref 0–53)
AST: 19 U/L (ref 0–37)
Albumin: 4.8 g/dL (ref 3.5–5.2)
Alkaline Phosphatase: 56 U/L (ref 39–117)
BUN: 17 mg/dL (ref 6–23)
CO2: 28 mEq/L (ref 19–32)
Calcium: 10.2 mg/dL (ref 8.4–10.5)
Chloride: 104 mEq/L (ref 96–112)
Creat: 1.2 mg/dL (ref 0.50–1.35)
GFR, Est African American: 78 mL/min
GFR, Est Non African American: 68 mL/min
Glucose, Bld: 87 mg/dL (ref 70–99)
Potassium: 4.9 mEq/L (ref 3.5–5.3)
Sodium: 142 mEq/L (ref 135–145)
Total Bilirubin: 0.6 mg/dL (ref 0.3–1.2)
Total Protein: 8 g/dL (ref 6.0–8.3)

## 2013-04-05 LAB — TSH: TSH: 2.112 u[IU]/mL (ref 0.350–4.500)

## 2013-04-06 ENCOUNTER — Telehealth: Payer: Self-pay | Admitting: Family Medicine

## 2013-04-06 NOTE — Telephone Encounter (Signed)
Called in.

## 2013-04-13 ENCOUNTER — Telehealth: Payer: Self-pay | Admitting: Family Medicine

## 2013-04-13 NOTE — Telephone Encounter (Signed)
Lab results- looks like not addressed yet

## 2013-04-14 NOTE — Telephone Encounter (Signed)
Just finished labs

## 2013-04-14 NOTE — Telephone Encounter (Signed)
Patient aware of lab.

## 2013-05-05 ENCOUNTER — Emergency Department: Payer: Self-pay | Admitting: Emergency Medicine

## 2013-05-05 LAB — BASIC METABOLIC PANEL
Calcium, Total: 9.1 mg/dL (ref 8.5–10.1)
Chloride: 106 mmol/L (ref 98–107)
EGFR (African American): >60
Glucose: 141 mg/dL — ABNORMAL HIGH (ref 65–99)
Osmolality: 274 (ref 275–301)
Potassium: 3.8 mmol/L (ref 3.5–5.1)
Sodium: 136 mmol/L (ref 136–145)

## 2013-05-05 LAB — CK TOTAL AND CKMB (NOT AT ARMC): CK-MB: <0.5 ng/mL — ABNORMAL LOW (ref 0.5–3.6)

## 2013-05-05 LAB — CBC
HCT: 49.8 % (ref 40.0–52.0)
HGB: 17.2 g/dL (ref 13.0–18.0)
MCH: 32.2 pg (ref 26.0–34.0)
MCHC: 34.6 g/dL (ref 32.0–36.0)
MCV: 93 fL (ref 80–100)
Platelet: 224 10*3/uL (ref 150–440)
RBC: 5.35 10*6/uL (ref 4.40–5.90)
RDW: 14.2 % (ref 11.5–14.5)
WBC: 11.3 10*3/uL — ABNORMAL HIGH (ref 3.8–10.6)

## 2013-05-05 LAB — TROPONIN I: Troponin-I: <0.02 ng/mL

## 2013-05-06 LAB — HEPATIC FUNCTION PANEL A (ARMC)
Alkaline Phosphatase: 87 U/L (ref 50–136)
Bilirubin,Total: 0.3 mg/dL (ref 0.2–1.0)
SGOT(AST): 20 U/L (ref 15–37)
Total Protein: 8.1 g/dL (ref 6.4–8.2)

## 2013-05-06 LAB — URINALYSIS, COMPLETE
Glucose,UR: NEGATIVE mg/dL (ref 0–75)
Leukocyte Esterase: NEGATIVE
Nitrite: NEGATIVE
Ph: 5 (ref 4.5–8.0)
Protein: 30
Specific Gravity: 1.027 (ref 1.003–1.030)

## 2013-05-06 LAB — PROTIME-INR: INR: 1

## 2013-05-10 ENCOUNTER — Observation Stay: Payer: Self-pay | Admitting: Surgery

## 2013-05-10 LAB — COMPREHENSIVE METABOLIC PANEL
BUN: 8 mg/dL (ref 7–18)
Calcium, Total: 9 mg/dL (ref 8.5–10.1)
Chloride: 102 mmol/L (ref 98–107)
Co2: 24 mmol/L (ref 21–32)
EGFR (African American): >60
SGOT(AST): 39 U/L — ABNORMAL HIGH (ref 15–37)
SGPT (ALT): 44 U/L (ref 12–78)
Sodium: 133 mmol/L — ABNORMAL LOW (ref 136–145)

## 2013-05-10 LAB — CBC WITH DIFFERENTIAL/PLATELET
Basophil #: 0.1 10*3/uL (ref 0.0–0.1)
Basophil %: 0.5 %
Lymphocyte #: 1.5 10*3/uL (ref 1.0–3.6)
MCH: 32.5 pg (ref 26.0–34.0)
Monocyte #: 1.3 x10 3/mm — ABNORMAL HIGH (ref 0.2–1.0)
RBC: 4.81 10*6/uL (ref 4.40–5.90)

## 2013-05-11 ENCOUNTER — Other Ambulatory Visit: Payer: Self-pay | Admitting: Family Medicine

## 2013-05-11 LAB — CBC WITH DIFFERENTIAL/PLATELET
Basophil %: 0.3 %
Lymphocyte %: 13.2 %
MCHC: 35.4 g/dL (ref 32.0–36.0)
MCV: 93 fL (ref 80–100)
Monocyte #: 0.9 x10 3/mm (ref 0.2–1.0)
Monocyte %: 7.6 %
Neutrophil #: 9.7 10*3/uL — ABNORMAL HIGH (ref 1.4–6.5)
RDW: 14 % (ref 11.5–14.5)
WBC: 12.3 10*3/uL — ABNORMAL HIGH (ref 3.8–10.6)

## 2013-05-11 LAB — COMPREHENSIVE METABOLIC PANEL
Albumin: 2.6 g/dL — ABNORMAL LOW (ref 3.4–5.0)
Alkaline Phosphatase: 216 U/L — ABNORMAL HIGH (ref 50–136)
Anion Gap: 6 — ABNORMAL LOW (ref 7–16)
BUN: 9 mg/dL (ref 7–18)
Calcium, Total: 8.5 mg/dL (ref 8.5–10.1)
Chloride: 102 mmol/L (ref 98–107)
Co2: 26 mmol/L (ref 21–32)
EGFR (African American): >60
Glucose: 100 mg/dL — ABNORMAL HIGH (ref 65–99)
Potassium: 4.1 mmol/L (ref 3.5–5.1)
SGOT(AST): 83 U/L — ABNORMAL HIGH (ref 15–37)
Total Protein: 6.8 g/dL (ref 6.4–8.2)

## 2013-05-23 ENCOUNTER — Ambulatory Visit: Payer: Medicaid Other | Admitting: Neurosurgery

## 2013-05-23 ENCOUNTER — Other Ambulatory Visit: Payer: Medicaid Other

## 2013-05-26 ENCOUNTER — Other Ambulatory Visit: Payer: Self-pay | Admitting: Surgery

## 2013-05-26 LAB — CBC WITH DIFFERENTIAL/PLATELET
Basophil #: 0.2 10*3/uL — ABNORMAL HIGH (ref 0.0–0.1)
Basophil %: 1.5 %
HCT: 45.9 % (ref 40.0–52.0)
HGB: 15.7 g/dL (ref 13.0–18.0)
Lymphocyte #: 2.2 10*3/uL (ref 1.0–3.6)
MCHC: 34.2 g/dL (ref 32.0–36.0)
Monocyte #: 0.7 x10 3/mm (ref 0.2–1.0)
Neutrophil #: 7.1 10*3/uL — ABNORMAL HIGH (ref 1.4–6.5)
RBC: 4.93 10*6/uL (ref 4.40–5.90)
RDW: 13.7 % (ref 11.5–14.5)

## 2013-05-26 LAB — COMPREHENSIVE METABOLIC PANEL
Alkaline Phosphatase: 128 U/L (ref 50–136)
Anion Gap: 5 — ABNORMAL LOW (ref 7–16)
BUN: 5 mg/dL — ABNORMAL LOW (ref 7–18)
Bilirubin,Total: 0.4 mg/dL (ref 0.2–1.0)
Calcium, Total: 9.1 mg/dL (ref 8.5–10.1)
Co2: 27 mmol/L (ref 21–32)
Creatinine: 0.87 mg/dL (ref 0.60–1.30)
EGFR (Non-African Amer.): >60
Osmolality: 265 (ref 275–301)
Potassium: 4.3 mmol/L (ref 3.5–5.1)
SGOT(AST): 19 U/L (ref 15–37)
Total Protein: 7.8 g/dL (ref 6.4–8.2)

## 2013-06-02 NOTE — Patient Instructions (Signed)

## 2013-06-02 NOTE — Progress Notes (Signed)
  Subjective:    Patient ID: Corey Barajas, male    DOB: May 18, 1956, 57 y.o.   MRN: [id number]  HPI  This 57 y.o. male presents for evaluation of ED, hyperlipidemia, and hypertension.  Review of Systems No chest pain, SOB, HA, dizziness, vision change, N/V, diarrhea, constipation, dysuria, urinary urgency or frequency, myalgias, arthralgias or rash.     Objective:   Physical Exam Vital signs noted  Well developed well nourished male.  HEENT - Head atraumatic Normocephalic                Eyes - PERRLA, Conjuctiva - clear Sclera- Clear EOMI                Ears - EAC's Wnl TM's Wnl Gross Hearing WNL                Nose - Nares patent                 Throat - oropharanx wnl Respiratory - Lungs CTA bilateral Cardiac - RRR S1 and S2 without murmur GI - Abdomen soft Nontender and bowel sounds active x 4 Extremities - No edema. Neuro - Grossly intact.       Assessment & Plan:  Essential hypertension, benign - Continue lisinopril - HCTZ 20-12.5 one po qd and bp is controlled.  Other and unspecified hyperlipidemia - Continue Pravastatin  Erectile dysfunction - Continue Viagra  Follow up in 6 months

## 2013-08-22 ENCOUNTER — Other Ambulatory Visit (HOSPITAL_COMMUNITY): Payer: Medicaid Other

## 2013-08-22 ENCOUNTER — Encounter (HOSPITAL_COMMUNITY): Payer: Medicaid Other

## 2013-08-22 ENCOUNTER — Ambulatory Visit: Payer: Medicaid Other | Admitting: Family

## 2013-09-08 ENCOUNTER — Other Ambulatory Visit: Payer: Self-pay | Admitting: *Deleted

## 2013-09-08 DIAGNOSIS — N529 Male erectile dysfunction, unspecified: Secondary | ICD-10-CM

## 2013-09-08 DIAGNOSIS — E785 Hyperlipidemia, unspecified: Secondary | ICD-10-CM

## 2013-09-08 DIAGNOSIS — I10 Essential (primary) hypertension: Secondary | ICD-10-CM

## 2013-09-08 NOTE — Telephone Encounter (Signed)
Last ov 04/04/13. Print rx for mail order. thanks

## 2013-09-09 MED ORDER — PRAVASTATIN SODIUM 40 MG PO TABS: 40.0000 mg | ORAL_TABLET | Freq: Every day | ORAL | Status: DC

## 2013-09-09 MED ORDER — LISINOPRIL-HYDROCHLOROTHIAZIDE 20-12.5 MG PO TABS: 1.0000 | ORAL_TABLET | Freq: Every day | ORAL | Status: DC

## 2013-09-12 ENCOUNTER — Other Ambulatory Visit: Payer: Self-pay | Admitting: *Deleted

## 2013-09-12 DIAGNOSIS — I10 Essential (primary) hypertension: Secondary | ICD-10-CM

## 2013-09-12 DIAGNOSIS — E785 Hyperlipidemia, unspecified: Secondary | ICD-10-CM

## 2013-09-12 DIAGNOSIS — N529 Male erectile dysfunction, unspecified: Secondary | ICD-10-CM

## 2013-09-12 MED ORDER — LISINOPRIL-HYDROCHLOROTHIAZIDE 20-12.5 MG PO TABS: 1.0000 | ORAL_TABLET | Freq: Every day | ORAL | Status: DC

## 2013-09-12 MED ORDER — PRAVASTATIN SODIUM 40 MG PO TABS: 40.0000 mg | ORAL_TABLET | Freq: Every day | ORAL | Status: DC

## 2013-09-30 ENCOUNTER — Telehealth: Payer: Self-pay | Admitting: Family Medicine

## 2013-11-18 ENCOUNTER — Other Ambulatory Visit: Payer: Self-pay | Admitting: Family Medicine

## 2013-11-21 NOTE — Telephone Encounter (Signed)
Patient last seen on 04-04-13. Was to follow up in 6 months. This request is for a 90 day supply from mail order. Please advise

## 2014-02-16 ENCOUNTER — Other Ambulatory Visit: Payer: Self-pay | Admitting: Family Medicine

## 2014-02-17 NOTE — Telephone Encounter (Signed)
Last seen 04/04/13  B [place]  No lipids in EPIC

## 2014-02-27 ENCOUNTER — Ambulatory Visit (INDEPENDENT_AMBULATORY_CARE_PROVIDER_SITE_OTHER): Payer: BC Managed Care – PPO | Admitting: Family Medicine

## 2014-02-27 ENCOUNTER — Encounter: Payer: Self-pay | Admitting: Family Medicine

## 2014-02-27 ENCOUNTER — Ambulatory Visit (INDEPENDENT_AMBULATORY_CARE_PROVIDER_SITE_OTHER): Payer: BC Managed Care – PPO

## 2014-02-27 VITALS — BP 132/82 | HR 83 | Temp 100.2°F | Ht 76.0 in | Wt 205.0 lb

## 2014-02-27 DIAGNOSIS — M549 Dorsalgia, unspecified: Secondary | ICD-10-CM

## 2014-02-27 MED ORDER — NAPROXEN 500 MG PO TABS: 500.0000 mg | ORAL_TABLET | Freq: Two times a day (BID) | ORAL | Status: DC

## 2014-02-27 MED ORDER — HYDROCODONE-ACETAMINOPHEN 5-325 MG PO TABS: 1.0000 | ORAL_TABLET | Freq: Four times a day (QID) | ORAL | Status: DC | PRN

## 2014-02-27 NOTE — Progress Notes (Signed)
   Subjective:    Patient ID: Corey Barajas, male    DOB: Aug 19, 1956, 58 y.o.   MRN: [id number]  HPI  This 58 y.o. male presents for evaluation of back pain after working in the garden this week.  Review of Systems    No chest pain, SOB, HA, dizziness, vision change, N/V, diarrhea, constipation, dysuria, urinary urgency or frequency, myalgias, arthralgias or rash.  Objective:   Physical Exam  Vital signs noted  Well developed well nourished male.  HEENT - Head atraumatic Normocephalic Respiratory - Lungs CTA bilateral Cardiac - RRR S1 and S2 without murmur MS - TTP LS spine paraspinous muscles  Xray LS spine - No fracture Prelimnary reading by Angeline Slim    Assessment & Plan:  Back pain - Plan: naproxen (NAPROSYN) 500 MG tablet, HYDROcodone-acetaminophen (NORCO) 5-325 MG per tablet, DG Lumbar Spine 2-3 Views  Deatra Canter FNP

## 2014-04-11 DIAGNOSIS — Z0279 Encounter for issue of other medical certificate: Secondary | ICD-10-CM

## 2014-05-17 ENCOUNTER — Other Ambulatory Visit: Payer: Self-pay | Admitting: Family Medicine

## 2014-07-28 ENCOUNTER — Other Ambulatory Visit: Payer: Self-pay | Admitting: Family Medicine

## 2014-09-07 ENCOUNTER — Encounter (HOSPITAL_COMMUNITY): Payer: Self-pay | Admitting: Cardiovascular Disease

## 2014-09-14 ENCOUNTER — Telehealth: Payer: Self-pay | Admitting: Family Medicine

## 2014-09-15 MED ORDER — PRAVASTATIN SODIUM 40 MG PO TABS: 40.0000 mg | ORAL_TABLET | Freq: Every day | ORAL | Status: DC

## 2014-09-15 MED ORDER — LISINOPRIL-HYDROCHLOROTHIAZIDE 20-12.5 MG PO TABS: 1.0000 | ORAL_TABLET | Freq: Every day | ORAL | Status: DC

## 2014-09-15 NOTE — Telephone Encounter (Signed)
done

## 2014-10-30 ENCOUNTER — Encounter: Payer: Self-pay | Admitting: Family

## 2014-10-30 ENCOUNTER — Ambulatory Visit (INDEPENDENT_AMBULATORY_CARE_PROVIDER_SITE_OTHER): Payer: BLUE CROSS/BLUE SHIELD | Admitting: Family

## 2014-10-30 VITALS — BP 137/84 | HR 91 | Temp 97.8°F | Ht 76.0 in | Wt 211.6 lb

## 2014-10-30 DIAGNOSIS — E559 Vitamin D deficiency, unspecified: Secondary | ICD-10-CM

## 2014-10-30 DIAGNOSIS — E785 Hyperlipidemia, unspecified: Secondary | ICD-10-CM | POA: Insufficient documentation

## 2014-10-30 DIAGNOSIS — Z125 Encounter for screening for malignant neoplasm of prostate: Secondary | ICD-10-CM

## 2014-10-30 DIAGNOSIS — I10 Essential (primary) hypertension: Secondary | ICD-10-CM

## 2014-10-30 DIAGNOSIS — L0291 Cutaneous abscess, unspecified: Secondary | ICD-10-CM

## 2014-10-30 DIAGNOSIS — N529 Male erectile dysfunction, unspecified: Secondary | ICD-10-CM

## 2014-10-30 MED ORDER — PRAVASTATIN SODIUM 40 MG PO TABS: 40.0000 mg | ORAL_TABLET | Freq: Every day | ORAL | Status: DC

## 2014-10-30 MED ORDER — SULFAMETHOXAZOLE-TRIMETHOPRIM 800-160 MG PO TABS: 1.0000 | ORAL_TABLET | Freq: Two times a day (BID) | ORAL | Status: DC

## 2014-10-30 MED ORDER — LISINOPRIL-HYDROCHLOROTHIAZIDE 20-12.5 MG PO TABS: 1.0000 | ORAL_TABLET | Freq: Every day | ORAL | Status: AC

## 2014-10-30 MED ORDER — SILDENAFIL CITRATE 20 MG PO TABS: ORAL_TABLET | ORAL | Status: AC

## 2014-10-30 NOTE — Patient Instructions (Signed)

## 2014-10-30 NOTE — Progress Notes (Signed)
Subjective:    Patient ID: Corey Barajas, male    DOB: [birth year], 59 y.o.   MRN: [id number]  Hypertension This is a chronic problem. The current episode started more than 1 year ago. The problem has been resolved since onset. The problem is controlled. Pertinent negatives include no anxiety, headaches, palpitations, peripheral edema or shortness of breath. Risk factors for coronary artery disease include dyslipidemia and male gender. Past treatments include ACE inhibitors and diuretics. The current treatment provides significant improvement. There is no history of kidney disease, CAD/MI, CVA, heart failure or a thyroid problem. There is no history of sleep apnea.  Hyperlipidemia This is a chronic problem. The current episode started more than 1 year ago. The problem is controlled. Recent lipid tests were reviewed and are normal. He has no history of diabetes or hypothyroidism. Pertinent negatives include no leg pain or shortness of breath. Current antihyperlipidemic treatment includes statins. The current treatment provides moderate improvement of lipids. Risk factors for coronary artery disease include dyslipidemia, hypertension and male sex.      Review of Systems  Constitutional: Negative.   HENT: Negative.   Respiratory: Negative.  Negative for shortness of breath.   Cardiovascular: Negative.  Negative for palpitations.  Gastrointestinal: Negative.   Endocrine: Negative.   Genitourinary: Negative.   Musculoskeletal: Negative.   Neurological: Negative.  Negative for headaches.  Hematological: Negative.   Psychiatric/Behavioral: Negative.   All other systems reviewed and are negative.      Objective:   Physical Exam  Constitutional: He is oriented to person, place, and time. He appears well-developed and well-nourished. No distress.  HENT:  Head: Normocephalic.  Right Ear: External ear normal.  Left Ear: External ear normal.  Nose: Nose normal.  Mouth/Throat: Oropharynx is  clear and moist.  Eyes: Pupils are equal, round, and reactive to light. Right eye exhibits no discharge. Left eye exhibits no discharge.  Neck: Normal range of motion. Neck supple. No thyromegaly present.  Cardiovascular: Normal rate, regular rhythm, normal heart sounds and intact distal pulses.   No murmur heard. Pulmonary/Chest: Effort normal and breath sounds normal. No respiratory distress. He has no wheezes.  Abdominal: Soft. Bowel sounds are normal. He exhibits no distension. There is no tenderness.  Musculoskeletal: Normal range of motion. He exhibits no edema or tenderness.  Neurological: He is alert and oriented to person, place, and time. He has normal reflexes. No cranial nerve deficit.  Skin: Skin is warm and dry. No rash noted. No erythema.  Psychiatric: He has a normal mood and affect. His behavior is normal. Judgment and thought content normal.  Vitals reviewed.     BP 137/84 mmHg  Pulse 91  Temp(Src) 97.8 F (36.6 C) (Oral)  Ht 6' 4"  (1.93 m)  Wt 211 lb 9.6 oz (95.981 kg)  BMI 25.77 kg/m2     Assessment & Plan:  1. Essential hypertension - CMP14+EGFR - lisinopril-hydrochlorothiazide (PRINZIDE,ZESTORETIC) 20-12.5 MG per tablet; Take 1 tablet by mouth daily.  Dispense: 90 tablet; Refill: 3  2. Hyperlipidemia - CMP14+EGFR - Lipid panel - pravastatin (PRAVACHOL) 40 MG tablet; Take 1 tablet (40 mg total) by mouth daily.  Dispense: 90 tablet; Refill: 3  3. Vitamin D deficiency - CMP14+EGFR - Vit D  25 hydroxy (rtn osteoporosis monitoring)  4. Prostate cancer screening - PSA, total and free  5. Erectile dysfunction, unspecified erectile dysfunction type - sildenafil (REVATIO) 20 MG tablet; Take 2-5 tabs as needed for sexual activity  Dispense: 50 tablet; Refill: 3  6. Abscess - sulfamethoxazole-trimethoprim (BACTRIM DS,SEPTRA DS) 800-160 MG per tablet; Take 1 tablet by mouth 2 (two) times daily.  Dispense: 10 tablet; Refill: 0   Continue all meds Labs  pending Health Maintenance reviewed Diet and exercise encouraged RTO 6  Evelina Dun, FNP

## 2014-10-31 ENCOUNTER — Other Ambulatory Visit: Payer: Self-pay | Admitting: Family

## 2014-10-31 LAB — CMP14+EGFR
ALBUMIN: 4.4 g/dL (ref 3.5–5.5)
ALT: 18 IU/L (ref 0–44)
AST: 18 IU/L (ref 0–40)
Albumin/Globulin Ratio: 1.5 (ref 1.1–2.5)
Alkaline Phosphatase: 65 IU/L (ref 39–117)
BILIRUBIN TOTAL: 0.4 mg/dL (ref 0.0–1.2)
BUN / CREAT RATIO: 12 (ref 9–20)
BUN: 14 mg/dL (ref 6–24)
CO2: 27 mmol/L (ref 18–29)
CREATININE: 1.15 mg/dL (ref 0.76–1.27)
Calcium: 9.5 mg/dL (ref 8.7–10.2)
Chloride: 100 mmol/L (ref 97–108)
GFR calc Af Amer: 81 mL/min/{1.73_m2} (ref >59)
GFR calc non Af Amer: 70 mL/min/{1.73_m2} (ref >59)
Globulin, Total: 2.9 g/dL (ref 1.5–4.5)
Glucose: 98 mg/dL (ref 65–99)
POTASSIUM: 4.5 mmol/L (ref 3.5–5.2)
Sodium: 141 mmol/L (ref 134–144)
Total Protein: 7.3 g/dL (ref 6.0–8.5)

## 2014-10-31 LAB — LIPID PANEL
CHOLESTEROL TOTAL: 211 mg/dL — AB (ref 100–199)
Chol/HDL Ratio: 4.6 ratio units (ref 0.0–5.0)
HDL: 46 mg/dL (ref >39)
LDL Calculated: 138 mg/dL — ABNORMAL HIGH (ref 0–99)
Triglycerides: 134 mg/dL (ref 0–149)
VLDL Cholesterol Cal: 27 mg/dL (ref 5–40)

## 2014-10-31 LAB — PSA, TOTAL AND FREE
PSA FREE: 0.23 ng/mL
PSA, Free Pct: 19.2 %
PSA: 1.2 ng/mL (ref 0.0–4.0)

## 2014-10-31 LAB — VITAMIN D 25 HYDROXY (VIT D DEFICIENCY, FRACTURES): VIT D 25 HYDROXY: 18.2 ng/mL — AB (ref 30.0–100.0)

## 2014-10-31 MED ORDER — VITAMIN D (ERGOCALCIFEROL) 1.25 MG (50000 UNIT) PO CAPS: 50000.0000 [IU] | ORAL_CAPSULE | ORAL | Status: DC

## 2014-10-31 MED ORDER — PRAVASTATIN SODIUM 80 MG PO TABS: 80.0000 mg | ORAL_TABLET | Freq: Every day | ORAL | Status: DC

## 2014-11-01 ENCOUNTER — Telehealth: Payer: Self-pay | Admitting: Family

## 2014-11-01 NOTE — Telephone Encounter (Signed)
Spoke with patient about labs 

## 2014-12-27 ENCOUNTER — Telehealth: Payer: Self-pay | Admitting: Family

## 2014-12-27 NOTE — Telephone Encounter (Signed)
error 

## 2015-01-09 ENCOUNTER — Telehealth: Payer: Self-pay

## 2015-01-09 NOTE — Telephone Encounter (Signed)
Insurance denied prior authorization for Sildenafil  Not covered for men with erectile dysfunction

## 2015-01-19 NOTE — Op Note (Signed)
PATIENT NAME:  Joshua Cruz, Joshua Cruz MR#:  [id number] DATE OF BIRTH:  09/11/1952  DATE OF PROCEDURE:  05/10/2013  ATTENDING SURGEON: Harrell Gave A. Liticia Gasior, MD  PREOPERATIVE DIAGNOSIS: Acute cholecystitis.   POSTOPERATIVE DIAGNOSIS: Acute cholecystitis.   PROCEDURE PERFORMED: Laparoscopic cholecystectomy.   SPECIMENS: Gallbladder.   ESTIMATED BLOOD LOSS: 150 mL.   COMPLICATIONS: None.   INDICATION FOR SURGERY: As follows: [name] is a pleasant 59 year old male who presents with 5 days of right upper quadrant pain and gallstone which appeared to be impacted in his gallbladder neck. He uses Plavix at baseline. He was thus brought to the operating room suite for laparoscopic cholecystectomy.   DETAILS OF PROCEDURE: As follows: Informed consent was obtained. [name] was brought to the operating suite. He was laid supine on the operating table. His abdomen was prepped and draped in standard sterile fashion. A supraumbilical incision was made. It was deepened down to the fascia. The fascia was incised. The peritoneum was entered. Two stay sutures were placed through the fascia. The abdomen was entered and insufflated. The gallbladder was evaluated. It was noted to be thick and edematous. An 11 mm epigastric port was then placed. Two 5 mm ports were placed in the right subcostal region at the midclavicular and anterior axillary line. The gallbladder was then lifted over the dome of the liver. It was extremely friable. With some difficulty, the rind around the gallbladder was taken down. The cystic artery and cystic duct were taken out. The critical view was obtained. The gallbladder was then taken off the gallbladder fossa. It was brought out through an Endo Catch bag with some difficulty. Gallbladder fossa was examined. It was noted to be hemostatic. I then placed 2 pieces of Surgicel in the gallbladder fossa. The JP was then left in the gallbladder fossa and brought out through the lateral port.  It was sutured in place with a 3-0 nylon suture. The ports were then taken out and desufflated. The supraumbilical port site fascia was closed with a 0 Vicryl figure-of-eight. All skin sites were then closed with interrupted 4-0 Monocryl. Steri-Strips, Telfa gauze and Tegaderm were then placed over all the wounds. The patient was then awoken, extubated and brought to the postanesthesia care unit. There were no immediate complications. Needle, sponge and instrument counts were correct at the end of the procedure.   ____________________________ Glena Norfolk. Janyra Barillas, MD cal:OSi D: 05/11/2013 07:54:03 ET T: 05/11/2013 08:56:54 ET JOB#: [id number]  cc: Harrell Gave A. Bradyn Soward, MD, <Dictator> Floyde Parkins MD ELECTRONICALLY SIGNED 05/13/2013 10:54

## 2015-01-19 NOTE — Discharge Summary (Signed)
PATIENT NAME:  Joshua Cruz, Joshua Cruz MR#:  [id number] DATE OF BIRTH:  09/11/1952  DATE OF ADMISSION:  05/10/2013 DATE OF DISCHARGE:  05/12/2013  PREOPERATIVE DIAGNOSIS: Acute cholecystitis.   POSTOPERATIVE DIAGNOSIS: Acute cholecystitis.   OTHER DISCHARGE DIAGNOSES 1.  History of mitral stenosis.  2.  History of myocardial infarction.  3.  History of cerebrovascular accident.    4.  History of multiple lower extremity vascular procedures.  5.  Hypertension.  6.  Hypercholesterolemia.  7.  Coronary artery disease.   PROCEDURE PERFORMED: Laparoscopic cholecystectomy on May 10, 2013.   DISCHARGE MEDICATIONS: As follows:  Zofran 4 p.o. q.4 hours. p.r.n. nausea or vomiting. Plavix 75 mg p.o. daily. Lisinopril 10 mg p.o. daily. Phenobarbital 64.8 mg p.o. daily. Atorvastatin 10 mg p.o. at bedtime. Diltiazem 24 hours-hour extended-release 120 mg p.o. daily. Percocet 1 to 2 tabs p.o. q.4 to 6 hours p.r.n. pain.   INDICATION FOR ADMISSION:  Mr. Caraher is a pleasant 59 year old male with a history of right upper quadrant pain and what looked like an impacted gallbladder neck stone. He was initially seen on August 8th by me and then stopped his Plavix and has returned for cholecystectomy on August 12th.   HOSPITAL COURSE:  Mr. Munn was admitted following his gallbladder surgery. He was given clear liquids and advanced later to regular diet. He was advanced from IV pain meds to p.o. pain medications. On the day of surgery, early in the morning, he did have a witnessed seizure with a seizure history and was begun on phenobarbital.  At the time of discharge, he was taking good p.o. with good p.o. pain control and was voiding and stooling without difficulties.  DISCHARGE INSTRUCTIONS:  Mr. Mochizuki is to follow up with me in approximately 1 week.  He is to call or return to the ED if he has increased pain, nausea, vomiting, redness, drainage from incision.   ____________________________ Glena Norfolk  Kordelia Severin, MD cal:cs D: 05/25/2013 17:24:28 ET T: 05/25/2013 19:46:31 ET JOB#: [id number]  cc: Harrell Gave A. Jaquila Santelli, MD, <Dictator> Floyde Parkins MD ELECTRONICALLY SIGNED 05/27/2013 9:25

## 2015-01-19 NOTE — H&P (Signed)
   Subjective/Chief Complaint Persistent RUQ pain.   History of Present Illness Joshua Cruz is a pleasant 59 yo M who presents with 5 days of RUQ pain.  It began last week and he presented to the ER at that time.  He was on plavix and did not want to be admitted for pain management.  He presented to my office today with persistent pain.  No plavix x 5 days.  Still tolerating diet but + nausea.  Would like cholecystectomy.   Past History H/o mitral stenosis H/o MI 30 years PTA H/o CVA/bells palsy H/o multiple LLE vascular procedures hypertension hypercholesterolemia   Past Medical Health Coronary Artery Disease, Hypertension, Smoking   Past Med/Surgical Hx:  Hypercholesterolemia:   Bells Palsy:   HTN:   Myocardial Infarct:   CVA/Stroke:   Angioplasty:   ALLERGIES:  Contrast dye: Unknown  Family and Social History:  Family History Cancer   Social History positive  tobacco, negative ETOH   + Tobacco Current (within 1 year)   Place of Living Home   Review of Systems:  Subjective/Chief Complaint RUQ pain, gallstones, leukocytosis   Fever/Chills No   Cough No   Sputum No   Abdominal Pain Yes   Diarrhea No   Constipation No   Nausea/Vomiting No   SOB/DOE No   Chest Pain No   Dysuria No   Tolerating PT No   Tolerating Diet Yes   Physical Exam:  GEN well developed, well nourished, no acute distress, Appears uncomfortable   HEENT pink conjunctivae, PERRL   RESP normal resp effort  clear BS  no use of accessory muscles   CARD regular rate  no murmur  no thrills   ABD positive tenderness  denies Flank Tenderness  no liver/spleen enlargement  no hernia  soft  normal BS  no Adominal Mass   SKIN normal to palpation, No rashes, No ulcers   NEURO cranial nerves intact, negative rigidity, negative tremor, follows commands   PSYCH A+O to time, place, person, good insight    Assessment/Admission Diagnosis [name] is a pleasant 59 yo M with nonmobile  gallstones, RUQ pain.   Plan To or for cholecystectomy   Electronic Signatures: Floyde Parkins (MD)  (Signed 12-Aug-14 15:17)  Authored: CHIEF COMPLAINT and HISTORY, PAST MEDICAL/SURGIAL HISTORY, ALLERGIES, FAMILY AND SOCIAL HISTORY, REVIEW OF SYSTEMS, PHYSICAL EXAM, ASSESSMENT AND PLAN   Last Updated: 12-Aug-14 15:17 by Floyde Parkins (MD)

## 2015-01-19 NOTE — Consult Note (Signed)
PATIENT NAME:  KRISTOFOR, MICHALOWSKI MR#:  [id number] DATE OF BIRTH:  09/11/1952  DATE OF CONSULTATION:  05/06/2013  CONSULTING PHYSICIAN:  Harrell Gave A. Alaya Iverson, MD  REASON FOR CONSULTATION: Epigastric and right upper quadrant pain for 9 hours.   HISTORY OF PRESENT ILLNESS: Mr. Apfel is a pleasant 59 year old male who presents with 9 hours of epigastric and right upper quadrant pain. He says it began suddenly after he awoke from his nap this evening. He says it has gotten progressively worse. Has tried milk and Rolaids and has not improved. He also reports subjective chills and sweats and nausea, but no vomiting. He had had a previous episode approximately 3 months ago in [place] that lasted 5 to 6 hours. Was worked up at [place] with ultrasound and CT scan, which were negative. He says that the pain comes in waves. He has also reported a right flank pain for 5 to 6 months, which he is unable to say, he does not believe though that this is similar. Also feels that he has foul-smelling urine and a possible bladder infection. No headache, chest pain, shortness of breath, cough.   PAST MEDICAL HISTORY:  1. History of mitral stenosis.  2. History of MI 30 years ago, requiring angioplasty.  3. History of CVA and Bell's palsy, for which he sees a neurologist.  4. History of multiple left lower extremity vascular procedures.  5. Hypertension.  6. Cholesterol.   HOME MEDICATIONS:  1. Plavix.  2. Lisinopril.  3. Penicillin he says ever since he had a stent.  4. Atorvastatin.  5. Phenobarbital.  6. Diltiazem.   ALLERGY: TO CONTRAST DYE.  SOCIAL HISTORY: History of 6 to 8 cigars per day. Denies alcohol. Lives around [place].   FAMILY HISTORY: Mother with small cell head cancer. No history of diabetes, high blood pressure, cardiac illness.   CARDIOLOGIST: Dr. Rollene Fare at [facility] in [place].    PHYSICAL EXAMINATION: As follows: VITAL SIGNS: Temperature 97.9, pulse 77, blood  pressure 153/77, respirations 20, 97% on room air.  GENERAL: Appears uncomfortable. Alert and oriented x3.  HEAD: Normocephalic, atraumatic.  EYES: No scleral icterus. No conjunctivitis.  CHEST: Lungs clear to auscultation. Moving air well.  HEART: Regular rate and rhythm. No murmurs, rubs or gallops.  ABDOMEN: Soft. Tender to palpation in right upper quadrant and epigastrium. Appears to be writhing.  EXTREMITIES: Moves all extremities well. Strength 5 out of 5. Has large long multiple incisions in his left lower extremity. NEUROLOGIC: Cranial nerves II through XII grossly intact. Neuro intact in all 4 extremities.   LABORATORY DATA: White cell count of 11.3, hemoglobin 17.2, hematocrit 49, platelets 224. INR 1. Lactate of 1.2. Lipase 143. BMP is normal.   IMAGING: Upper quadrant ultrasound shows gallstones and sludge, with no obvious mention of pericholecystic fluid or gallbladder wall thickening. It does appear to have nonmobile stones in neck.   ASSESSMENT AND PLAN: Mr. Mathey is a pleasant 59 year old male with history of epigastric right upper quadrant pain, leukocytosis and possible obstructing stones, with pain that is biliary in nature. Would likely benefit from cholecystectomy. Would require being off Plavix for 5 days and cardiac workup. I have offered the patient admission for probable cholecystectomy. Will follow up with LFTs as an outpatient to determine what he would prefer.   ____________________________ Glena Norfolk. Sharonlee Nine, MD cal:OSi D: 05/06/2013 04:53:06 ET T: 05/06/2013 05:55:48 ET JOB#: [id number]  cc: Harrell Gave A. Jimmi Sidener, MD, <Dictator> Floyde Parkins MD ELECTRONICALLY SIGNED 05/13/2013 10:50

## 2015-04-10 ENCOUNTER — Encounter: Payer: Self-pay | Admitting: *Deleted

## 2015-04-20 ENCOUNTER — Encounter: Payer: Self-pay | Admitting: Physician Assistant

## 2015-04-20 ENCOUNTER — Ambulatory Visit (INDEPENDENT_AMBULATORY_CARE_PROVIDER_SITE_OTHER): Payer: BLUE CROSS/BLUE SHIELD | Admitting: Physician Assistant

## 2015-04-20 ENCOUNTER — Ambulatory Visit (INDEPENDENT_AMBULATORY_CARE_PROVIDER_SITE_OTHER): Payer: BLUE CROSS/BLUE SHIELD

## 2015-04-20 VITALS — BP 116/81 | HR 80 | Temp 97.7°F | Ht 76.0 in | Wt 209.0 lb

## 2015-04-20 DIAGNOSIS — M25511 Pain in right shoulder: Secondary | ICD-10-CM

## 2015-04-20 MED ORDER — PREDNISONE 10 MG (21) PO TBPK: ORAL_TABLET | ORAL | Status: DC

## 2015-04-20 MED ORDER — MELOXICAM 15 MG PO TABS: 15.0000 mg | ORAL_TABLET | Freq: Every day | ORAL | Status: AC

## 2015-04-20 NOTE — Progress Notes (Signed)
   Subjective:    Patient ID: Corey Barajas, male    DOB: May 05, 1956, 59 y.o.   MRN: [id number]  HPI 59 y/o male presents with c/o right shoulder pain x 1-2 weeks. 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 He states that the pain is intermittent, wakes him up at night. He has tried tylenol, aleve and motrin with mild relief. He states that the pain does not occur with activity. It is more noticeable with sitting down. Does not have pain with playing golf.     Review of Systems  Constitutional: Negative.   HENT: Negative.   Eyes: Negative.   Respiratory: Negative.   Cardiovascular: Negative.   Gastrointestinal: Negative.   Endocrine: Negative.   Genitourinary: Negative.   Musculoskeletal: Positive for arthralgias (right shoulder pain ). Negative for back pain.       Pain is worse with shoulder abduction, adduction  Neurological:       Denies numbness,tingling in extremities       Objective:   Physical Exam  Constitutional: He appears well-developed and well-nourished.  Musculoskeletal: Normal range of motion. He exhibits tenderness (mild ttp over spine of scapula, acromion region). He exhibits no edema.  Nursing note and vitals reviewed.         Assessment & Plan:  1. Pain in joint, shoulder region, right  - DG Shoulder Right; Future - meloxicam (MOBIC) 15 MG tablet; Take 1 tablet (15 mg total) by mouth daily.  Dispense: 30 tablet; Refill: 1     Lopaka Karge A. Chauncey Reading PA-C

## 2015-05-01 ENCOUNTER — Ambulatory Visit (INDEPENDENT_AMBULATORY_CARE_PROVIDER_SITE_OTHER): Payer: BLUE CROSS/BLUE SHIELD | Admitting: Family

## 2015-05-01 ENCOUNTER — Encounter: Payer: Self-pay | Admitting: Family

## 2015-05-01 VITALS — BP 126/86 | HR 82 | Temp 97.4°F | Ht 76.0 in | Wt 210.0 lb

## 2015-05-01 DIAGNOSIS — I10 Essential (primary) hypertension: Secondary | ICD-10-CM | POA: Diagnosis not present

## 2015-05-01 DIAGNOSIS — M19011 Primary osteoarthritis, right shoulder: Secondary | ICD-10-CM

## 2015-05-01 DIAGNOSIS — N529 Male erectile dysfunction, unspecified: Secondary | ICD-10-CM

## 2015-05-01 DIAGNOSIS — E785 Hyperlipidemia, unspecified: Secondary | ICD-10-CM | POA: Diagnosis not present

## 2015-05-01 DIAGNOSIS — E559 Vitamin D deficiency, unspecified: Secondary | ICD-10-CM | POA: Diagnosis not present

## 2015-05-01 MED ORDER — METHYLPREDNISOLONE ACETATE 80 MG/ML IJ SUSP
80.0000 mg | Freq: Once | INTRAMUSCULAR | Status: AC
  Administered 2015-05-01: 80 mg via INTRA_ARTICULAR

## 2015-05-01 MED ORDER — BUPIVACAINE HCL 0.25 % IJ SOLN
1.0000 mL | Freq: Once | INTRAMUSCULAR | Status: AC
  Administered 2015-05-01: 1 mL via INTRA_ARTICULAR

## 2015-05-01 NOTE — Patient Instructions (Addendum)
Osteoarthritis Osteoarthritis is a disease that causes soreness and inflammation of a joint. It occurs when the cartilage at the affected joint wears down. Cartilage acts as a cushion, covering the ends of bones where they meet to form a joint. Osteoarthritis is the most common form of arthritis. It often occurs in older people. The joints affected most often by this condition include those in the:  Ends of the fingers.  Thumbs.  Neck.  Lower back.  Knees.  Hips. CAUSES  Over time, the cartilage that covers the ends of bones begins to wear away. This causes bone to rub on bone, producing pain and stiffness in the affected joints.  RISK FACTORS Certain factors can increase your chances of having osteoarthritis, including:  Older age.  Excessive body weight.  Overuse of joints.  Previous joint injury. SIGNS AND SYMPTOMS   Pain, swelling, and stiffness in the joint.  Over time, the joint may lose its normal shape.  Small deposits of bone (osteophytes) may grow on the edges of the joint.  Bits of bone or cartilage can break off and float inside the joint space. This may cause more pain and damage. DIAGNOSIS  Your health care provider will do a physical exam and ask about your symptoms. Various tests may be ordered, such as:  X-rays of the affected joint.  An MRI scan.  Blood tests to rule out other types of arthritis.  Joint fluid tests. This involves using a needle to draw fluid from the joint and examining the fluid under a microscope. TREATMENT  Goals of treatment are to control pain and improve joint function. Treatment plans may include:  A prescribed exercise program that allows for rest and joint relief.  A weight control plan.  Pain relief techniques, such as:  Properly applied heat and cold.  Electric pulses delivered to nerve endings under the skin (transcutaneous electrical nerve stimulation [TENS]).  Massage.  Certain nutritional  supplements.  Medicines to control pain, such as:  Acetaminophen.  Nonsteroidal anti-inflammatory drugs (NSAIDs), such as naproxen.  Narcotic or central-acting agents, such as tramadol.  Corticosteroids. These can be given orally or as an injection.  Surgery to reposition the bones and relieve pain (osteotomy) or to remove loose pieces of bone and cartilage. Joint replacement may be needed in advanced states of osteoarthritis. HOME CARE INSTRUCTIONS   Take medicines only as directed by your health care provider.  Maintain a healthy weight. Follow your health care provider's instructions for weight control. This may include dietary instructions.  Exercise as directed. Your health care provider can recommend specific types of exercise. These may include:  Strengthening exercises. These are done to strengthen the muscles that support joints affected by arthritis. They can be performed with weights or with exercise bands to add resistance.  Aerobic activities. These are exercises, such as brisk walking or low-impact aerobics, that get your heart pumping.  Range-of-motion activities. These keep your joints limber.  Balance and agility exercises. These help you maintain daily living skills.  Rest your affected joints as directed by your health care provider.  Keep all follow-up visits as directed by your health care provider. SEEK MEDICAL CARE IF:   Your skin turns red.  You develop a rash in addition to your joint pain.  You have worsening joint pain.  You have a fever along with joint or muscle aches. SEEK IMMEDIATE MEDICAL CARE IF:  You have a significant loss of weight or appetite.  You have night sweats. FOR MORE  INFORMATION   [employer] of Arthritis and Musculoskeletal and Skin Diseases: www.niams.http://www.myers.net/  [employer] on Aging: https://walker.com/  American College of Rheumatology: www.rheumatology.org Document Released: 09/15/2005 Document Revised:  01/30/2014 Document Reviewed: 05/23/2013 [facility] Patient Information 2015 [place], [place]. This information is not intended to replace advice given to you by your health care provider. Make sure you discuss any questions you have with your health care provider. Health Maintenance A healthy lifestyle and preventative care can promote health and wellness.  Maintain regular health, dental, and eye exams.  Eat a healthy diet. Foods like vegetables, fruits, whole grains, low-fat dairy products, and lean protein foods contain the nutrients you need and are low in calories. Decrease your intake of foods high in solid fats, added sugars, and salt. Get information about a proper diet from your health care provider, if necessary.  Regular physical exercise is one of the most important things you can do for your health. Most adults should get at least 150 minutes of moderate-intensity exercise (any activity that increases your heart rate and causes you to sweat) each week. In addition, most adults need muscle-strengthening exercises on 2 or more days a week.   Maintain a healthy weight. The body mass index (BMI) is a screening tool to identify possible weight problems. It provides an estimate of body fat based on height and weight. Your health care provider can find your BMI and can help you achieve or maintain a healthy weight. For males 20 years and older:  A BMI below 18.5 is considered underweight.  A BMI of 18.5 to 24.9 is normal.  A BMI of 25 to 29.9 is considered overweight.  A BMI of 30 and above is considered obese.  Maintain normal blood lipids and cholesterol by exercising and minimizing your intake of saturated fat. Eat a balanced diet with plenty of fruits and vegetables. Blood tests for lipids and cholesterol should begin at age 21 and be repeated every 5 years. If your lipid or cholesterol levels are high, you are over age 1, or you are at high risk for heart disease, you may need your  cholesterol levels checked more frequently.Ongoing high lipid and cholesterol levels should be treated with medicines if diet and exercise are not working.  If you smoke, find out from your health care provider how to quit. If you do not use tobacco, do not start.  Lung cancer screening is recommended for adults aged 55-80 years who are at high risk for developing lung cancer because of a history of smoking. A yearly low-dose CT scan of the lungs is recommended for people who have at least a 30-pack-year history of smoking and are current smokers or have quit within the past 15 years. A pack year of smoking is smoking an average of 1 pack of cigarettes a day for 1 year (for example, a 30-pack-year history of smoking could mean smoking 1 pack a day for 30 years or 2 packs a day for 15 years). Yearly screening should continue until the smoker has stopped smoking for at least 15 years. Yearly screening should be stopped for people who develop a health problem that would prevent them from having lung cancer treatment.  If you choose to drink alcohol, do not have more than 2 drinks per day. One drink is considered to be 12 oz (360 mL) of beer, 5 oz (150 mL) of wine, or 1.5 oz (45 mL) of liquor.  Avoid the use of street drugs. Do not share needles  with anyone. Ask for help if you need support or instructions about stopping the use of drugs.  High blood pressure causes heart disease and increases the risk of stroke. Blood pressure should be checked at least every 1-2 years. Ongoing high blood pressure should be treated with medicines if weight loss and exercise are not effective.  If you are 48-11 years old, ask your health care provider if you should take aspirin to prevent heart disease.  Diabetes screening involves taking a blood sample to check your fasting blood sugar level. This should be done once every 3 years after age 49 if you are at a normal weight and without risk factors for diabetes. Testing  should be considered at a younger age or be carried out more frequently if you are overweight and have at least 1 risk factor for diabetes.  Colorectal cancer can be detected and often prevented. Most routine colorectal cancer screening begins at the age of 67 and continues through age 64. However, your health care provider may recommend screening at an earlier age if you have risk factors for colon cancer. On a yearly basis, your health care provider may provide home test kits to check for hidden blood in the stool. A small camera at the end of a tube may be used to directly examine the colon (sigmoidoscopy or colonoscopy) to detect the earliest forms of colorectal cancer. Talk to your health care provider about this at age 42 when routine screening begins. A direct exam of the colon should be repeated every 5-10 years through age 46, unless early forms of precancerous polyps or small growths are found.  People who are at an increased risk for hepatitis B should be screened for this virus. You are considered at high risk for hepatitis B if:  You were born in a country where hepatitis B occurs often. Talk with your health care provider about which countries are considered high risk.  Your parents were born in a high-risk country and you have not received a shot to protect against hepatitis B (hepatitis B vaccine).  You have HIV or AIDS.  You use needles to inject street drugs.  You live with, or have sex with, someone who has hepatitis B.  You are a man who has sex with other men (MSM).  You get hemodialysis treatment.  You take certain medicines for conditions like cancer, organ transplantation, and autoimmune conditions.  Hepatitis C blood testing is recommended for all people born from [birth year] through 1964 and any individual with known risk factors for hepatitis C.  Healthy men should no longer receive prostate-specific antigen (PSA) blood tests as part of routine cancer screening. Talk to  your health care provider about prostate cancer screening.  Testicular cancer screening is not recommended for adolescents or adult males who have no symptoms. Screening includes self-exam, a health care provider exam, and other screening tests. Consult with your health care provider about any symptoms you have or any concerns you have about testicular cancer.  Practice safe sex. Use condoms and avoid high-risk sexual practices to reduce the spread of sexually transmitted infections (STIs).  You should be screened for STIs, including gonorrhea and chlamydia if:  You are sexually active and are younger than 24 years.  You are older than 24 years, and your health care provider tells you that you are at risk for this type of infection.  Your sexual activity has changed since you were last screened, and you are at an increased risk  for chlamydia or gonorrhea. Ask your health care provider if you are at risk.  If you are at risk of being infected with HIV, it is recommended that you take a prescription medicine daily to prevent HIV infection. This is called pre-exposure prophylaxis (PrEP). You are considered at risk if:  You are a man who has sex with other men (MSM).  You are a heterosexual man who is sexually active with multiple partners.  You take drugs by injection.  You are sexually active with a partner who has HIV.  Talk with your health care provider about whether you are at high risk of being infected with HIV. If you choose to begin PrEP, you should first be tested for HIV. You should then be tested every 3 months for as long as you are taking PrEP.  Use sunscreen. Apply sunscreen liberally and repeatedly throughout the day. You should seek shade when your shadow is shorter than you. Protect yourself by wearing long sleeves, pants, a wide-brimmed hat, and sunglasses year round whenever you are outdoors.  Tell your health care provider of new moles or changes in moles, especially if  there is a change in shape or color. Also, tell your health care provider if a mole is larger than the size of a pencil eraser.  A one-time screening for abdominal aortic aneurysm (AAA) and surgical repair of large AAAs by ultrasound is recommended for men aged 65-75 years who are current or former smokers.  Stay current with your vaccines (immunizations). Document Released: 03/13/2008 Document Revised: 09/20/2013 Document Reviewed: 02/10/2011 [facility] Patient Information 2015 [place], [place]. This information is not intended to replace advice given to you by your health care provider. Make sure you discuss any questions you have with your health care provider.

## 2015-05-01 NOTE — Progress Notes (Signed)
Subjective:    Patient ID: Corey Barajas, male    DOB: 20-May-1956, 58 y.o.   MRN: [id number]  Pt presents to the office today for chronic follow up.  Hypertension This is a chronic problem. The current episode started more than 1 year ago. The problem has been resolved since onset. The problem is controlled. Pertinent negatives include no anxiety, headaches, palpitations, peripheral edema or shortness of breath. Risk factors for coronary artery disease include dyslipidemia and male gender. Past treatments include ACE inhibitors and diuretics. The current treatment provides significant improvement. There is no history of kidney disease, CAD/MI, CVA, heart failure or a thyroid problem. There is no history of sleep apnea.  Hyperlipidemia This is a chronic problem. The current episode started more than 1 year ago. The problem is uncontrolled. Recent lipid tests were reviewed and are high. He has no history of diabetes or hypothyroidism. Pertinent negatives include no leg pain or shortness of breath. Current antihyperlipidemic treatment includes statins. The current treatment provides moderate improvement of lipids. Risk factors for coronary artery disease include dyslipidemia, hypertension and male sex.  Arthritis Presents for follow-up visit. He complains of pain and joint swelling. Affected locations include the right shoulder. His pain is at a severity of 6/10. Past treatments include rest and corticosteroids.      Review of Systems  Constitutional: Negative.   HENT: Negative.   Respiratory: Negative.  Negative for shortness of breath.   Cardiovascular: Negative.  Negative for palpitations.  Gastrointestinal: Negative.   Endocrine: Negative.   Genitourinary: Negative.   Musculoskeletal: Positive for joint swelling and arthritis.  Neurological: Negative.  Negative for headaches.  Hematological: Negative.   Psychiatric/Behavioral: Negative.   All other systems reviewed and are  negative.      Objective:   Physical Exam  Constitutional: He is oriented to person, place, and time. He appears well-developed and well-nourished. No distress.  HENT:  Head: Normocephalic.  Right Ear: External ear normal.  Left Ear: External ear normal.  Nose: Nose normal.  Mouth/Throat: Oropharynx is clear and moist.  Eyes: Pupils are equal, round, and reactive to light. Right eye exhibits no discharge. Left eye exhibits no discharge.  Neck: Normal range of motion. Neck supple. No thyromegaly present.  Cardiovascular: Normal rate, regular rhythm, normal heart sounds and intact distal pulses.   No murmur heard. Pulmonary/Chest: Effort normal and breath sounds normal. No respiratory distress. He has no wheezes.  Abdominal: Soft. Bowel sounds are normal. He exhibits no distension. There is no tenderness.  Musculoskeletal: Normal range of motion. He exhibits no edema or tenderness.  Neurological: He is alert and oriented to person, place, and time. He has normal reflexes. No cranial nerve deficit.  Skin: Skin is warm and dry. No rash noted. No erythema.  Psychiatric: He has a normal mood and affect. His behavior is normal. Judgment and thought content normal.  Vitals reviewed.  Procedure Note Area cleaned with chlorhexidine While the patient sits with their arm resting by their side I inject 1 ml of marcaine and 1ml of depo-medrol at the Posterior edge of the acromioninferior to the posterolateral acromion and directed laterally into the subacromial space, aiming for the anterolateral corner of the acromion.  BP 126/86 mmHg  Pulse 82  Temp(Src) 97.4 F (36.3 C) (Oral)  Ht  (1.93 m)  Wt 210 lb (95.255 kg)  BMI 25.57 kg/m2      Assessment & Plan:  1. Essential hypertension  2. Hyperlipidemia  3. Vitamin D  deficiency  4. Erectile dysfunction, unspecified erectile dysfunction type   5. Primary osteoarthritis of right shoulder - methylPREDNISolone acetate  (DEPO-MEDROL) injection 80 mg; Inject 1 mL (80 mg total) into the articular space once. - bupivacaine (MARCAINE) 0.25 % (with pres) injection 1 mL; Inject 1 mL into the articular space once.   Continue all meds Labs discussed- PT had blood work drawn at work Health Maintenance reviewed Diet and exercise encouraged RTO 6 months  Jannifer Rodney, [place]

## 2015-08-07 ENCOUNTER — Encounter: Payer: Self-pay | Admitting: Family Medicine

## 2015-08-07 ENCOUNTER — Ambulatory Visit (INDEPENDENT_AMBULATORY_CARE_PROVIDER_SITE_OTHER): Payer: BLUE CROSS/BLUE SHIELD | Admitting: Family Medicine

## 2015-08-07 VITALS — BP 130/80 | HR 81 | Temp 98.3°F | Ht 76.0 in | Wt 213.2 lb

## 2015-08-07 DIAGNOSIS — M75101 Unspecified rotator cuff tear or rupture of right shoulder, not specified as traumatic: Secondary | ICD-10-CM

## 2015-08-07 DIAGNOSIS — M75100 Unspecified rotator cuff tear or rupture of unspecified shoulder, not specified as traumatic: Secondary | ICD-10-CM | POA: Insufficient documentation

## 2015-08-07 NOTE — Patient Instructions (Signed)
Great to meet you!  Come back as needed, We will arrange your sports medicine referral  Try the stretches  Continue motrin as needed, try to make sure you have food on your stomach when you take it.

## 2015-08-07 NOTE — Progress Notes (Signed)
   HPI  Patient presents today for right shoulder pain.  Patient explains that over the last year or so he's had an annoying type anterior shoulder pain around the area has acromioclavicular joint. He has no real limitations in activity and cant really name any exacerbating factors. He denies any trauma He had a right shoulder injection about 3 months ago which helped quite a bit and requests another today. 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  PMH: Smoking status noted ROS: Per HPI  Objective: BP 130/80 mmHg  Pulse 81  Temp(Src) 98.3 F (36.8 C) (Oral)  Ht 6\' 4"  (1.93 m)  Wt 213 lb 3.2 oz (96.707 kg)  BMI 25.96 kg/m2 Gen: NAD, alert, cooperative with exam HEENT: NCAT CV: RRR, good S1/S2, no murmur Resp: CTABL, no wheezes, non-labored Abd: SNTND, BS present, no guarding or organomegaly Ext: No edema, warm Neuro: Alert and oriented, No gross deficits MSK: Right shoulder with no tenderness to palpation of bony landmarks No deformity, erythema, or swelling on inspection Hawkins and empty can test with slight pain which reproduces the pain he is here complaining of. Apley scratch test with limited range of motion posteriorly to the mid back Full range of motion on flexion-extension and abduction  Right shoulder injection Area cleaned with iodine x 2 and wiped clear with alcohol swab.  Using 21 1/2 gauge needle 1 cc 80 mg/mL depo medrol and 3 cc's 2% xylocaine were injected in subacromial space via posterior approach.  Sterile bandage placed.  Patient tolerated procedure well.  No complications.     Assessment and plan:  # Rotator cuff syndrome Rotator cuff syndrome with provocative tests, x-ray with signs of OA This is his second injection, the last was 3 months ago Discussed avoiding frequent injections and limiting to no more  than every 3 months and no more than 4 times in a lifetime Recommended and referred to sports medicine, consider musculoskeletal ultrasound and specific exercises to improve the long-term function and pain Given rotator cuff rehabilitation exercises from the sports medicine patient advisor Continue motrin  Orders Placed This Encounter  Procedures  . Ambulatory referral to Sports Medicine    Referral Priority:  Routine    Referral Type:  Consultation    Number of Visits Requested:  1    No orders of the defined types were placed in this encounter.    Murtis SinkSam Bradshaw, MD [facility] 08/07/2015, 4:20 PM

## 2015-09-13 ENCOUNTER — Other Ambulatory Visit: Payer: Self-pay | Admitting: Family

## 2015-09-13 NOTE — Telephone Encounter (Signed)
Last seen 08/07/15 Dr Ermalinda MemosBradshaw  Last Vit D  10/30/14  18.2

## 2015-09-19 ENCOUNTER — Other Ambulatory Visit: Payer: Self-pay | Admitting: Family

## 2015-09-20 ENCOUNTER — Other Ambulatory Visit: Payer: Self-pay | Admitting: Family

## 2015-09-30 DEATH — deceased

## 2015-10-21 IMAGING — CR DG LUMBAR SPINE 2-3V
2 series · 2 of 2 positions shown · non-contrast
Comparison: None.

CLINICAL DATA: Lower back pain.

EXAM:
LUMBAR SPINE - 2-3 VIEW

[view not recorded (1 of 2)]
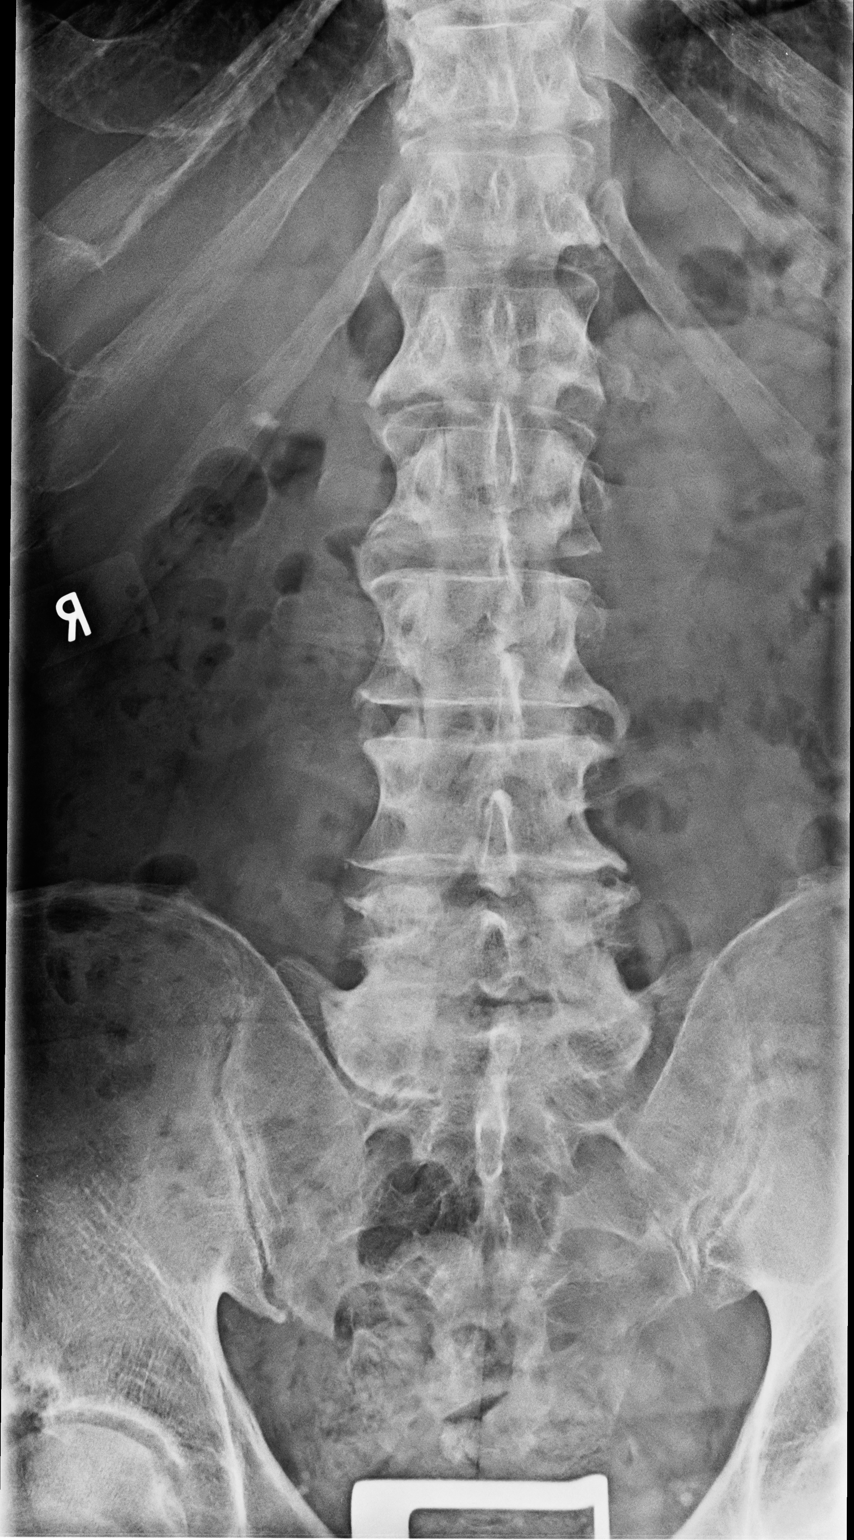

[view not recorded (2 of 2)]
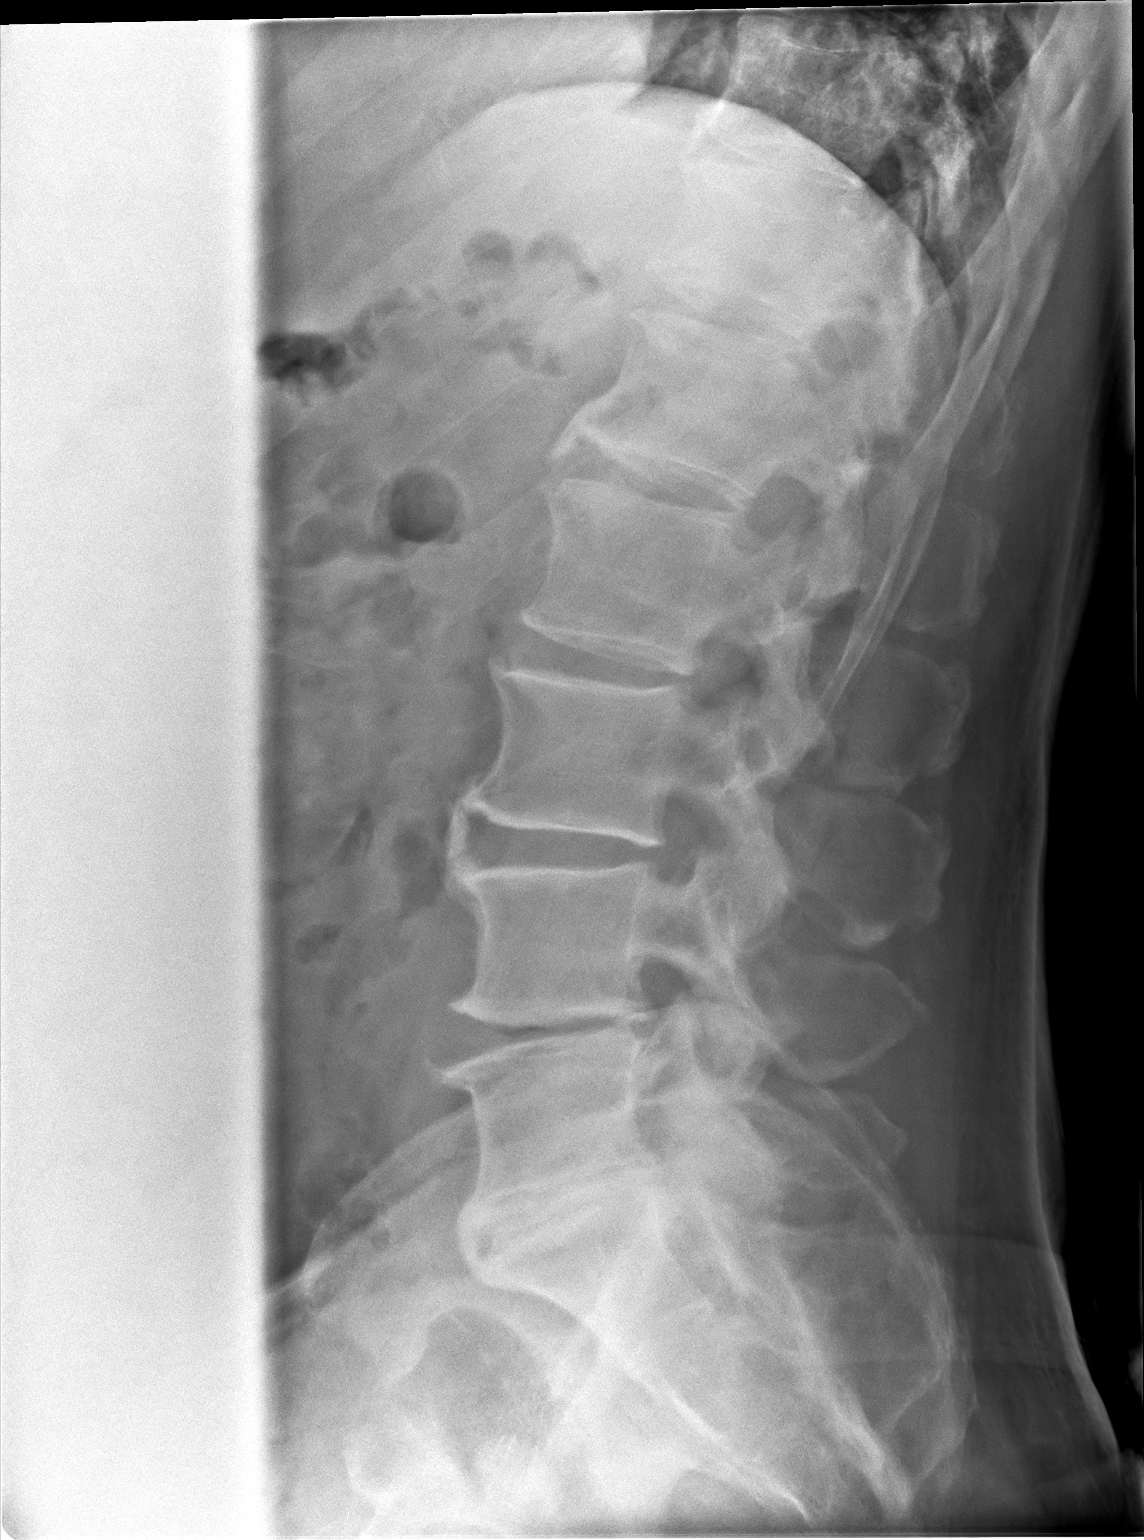

[2 of 2 positions shown; findings below may reference images not displayed]

FINDINGS: No fracture or spondylolisthesis is noted. Severe degenerative disc
disease is noted at L4-5 and L5-S1 with anterior osteophyte
formation. Anterior osteophyte formation is also noted at L1-2 and
L3-4. Possible right renal calculus.
IMPRESSION: Multilevel degenerative disc disease. No acute abnormality seen in
the lumbar spine. Possible right renal calculus.

## 2017-01-01 ENCOUNTER — Encounter: Payer: Self-pay | Admitting: Internal Medicine
